# Patient Record
Sex: Male | Born: 1995
Health system: Southern US, Community
[De-identification: ages and names within clinical notes are randomized; demographics above are authoritative.]

## PROBLEM LIST (undated history)

## (undated) DIAGNOSIS — H5000 Unspecified esotropia: Secondary | ICD-10-CM

## (undated) DIAGNOSIS — Z8719 Personal history of other diseases of the digestive system: Secondary | ICD-10-CM

## (undated) DIAGNOSIS — Z87820 Personal history of traumatic brain injury: Secondary | ICD-10-CM

## (undated) HISTORY — PX: WISDOM TOOTH EXTRACTION: SHX21

## (undated) HISTORY — PX: PILONIDAL CYST EXCISION: SHX744

## (undated) HISTORY — PX: TYMPANOPLASTY WITH GRAFT: SHX6567

---
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
## 2015-11-03 MED FILL — PREVIDENT 5000 BOOSTER PLUS: 1.1 | 30 days supply | Qty: 100 | Fill #0

## 2015-12-31 ENCOUNTER — Ambulatory Visit: Payer: 59 | Admitting: Family Medicine

## 2016-01-07 DIAGNOSIS — H5203 Hypermetropia, bilateral: Secondary | ICD-10-CM | POA: Diagnosis not present

## 2016-01-07 DIAGNOSIS — H5043 Accommodative component in esotropia: Secondary | ICD-10-CM | POA: Diagnosis not present

## 2016-02-05 ENCOUNTER — Ambulatory Visit: Payer: 59 | Admitting: Family Medicine

## 2016-04-29 ENCOUNTER — Encounter: Payer: Self-pay | Admitting: Family Medicine

## 2016-04-29 ENCOUNTER — Ambulatory Visit (INDEPENDENT_AMBULATORY_CARE_PROVIDER_SITE_OTHER): Payer: 59 | Admitting: Family Medicine

## 2016-04-29 VITALS — BP 104/66 | HR 82 | Temp 97.8°F | Ht 75.25 in | Wt 188.0 lb

## 2016-04-29 DIAGNOSIS — J0100 Acute maxillary sinusitis, unspecified: Secondary | ICD-10-CM | POA: Diagnosis not present

## 2016-04-29 DIAGNOSIS — K219 Gastro-esophageal reflux disease without esophagitis: Secondary | ICD-10-CM | POA: Insufficient documentation

## 2016-04-29 MED ORDER — PREDNISONE 20 MG PO TABS: ORAL_TABLET | ORAL | Status: DC

## 2016-04-29 MED ORDER — AMOXICILLIN-POT CLAVULANATE 875-125 MG PO TABS: 1.0000 | ORAL_TABLET | Freq: Two times a day (BID) | ORAL | Status: DC

## 2016-04-29 MED FILL — AMOX TR-K CLV 875-125 MG TA: 875-125 | 10 days supply | Qty: 20 | Fill #0

## 2016-04-29 MED FILL — predniSONE 20 MG TABS: 20 | 7 days supply | Qty: 10 | Fill #0

## 2016-04-29 NOTE — Patient Instructions (Addendum)
Seems like this is more focused in sinuses at least today- cover for sinusitis with prednisone and augmentin  If symptoms persist over the next week- call us and we can set up chest x-ray with lingering shortness of breath and cough  Further decision making at that point

## 2016-04-29 NOTE — Progress Notes (Signed)
Phone: [phone]  Subjective:  Patient presents today to establish care. Was seen by Netta Cedarshris Miller, MD previously [facility]. Chief complaint-noted.   See problem oriented charting  The following were reviewed and entered/updated in epic: Past Medical History  Diagnosis Date  . GERD (gastroesophageal reflux disease)     rare issues  . Pilonidal cyst with abscess   . Concussion     basketball at [employer]   Patient Active Problem List   Diagnosis Date Noted  . GERD (gastroesophageal reflux disease)   . Pilonidal cyst 05/22/2013   Past Surgical History  Procedure Laterality Date  . Eardrum reconstruction      twice. perforated ear drum  . Tonsillectomy    . Pilonidal cyst excision      around 2014    Family History  Problem Relation Age of Onset  . Breast cancer Maternal Grandmother     breast  . Melanoma Paternal Grandfather   . Hashimoto's thyroiditis Mother     now on thyroid medicine  . Kidney Stones Father   . Diabetes Paternal Grandfather     Medications- reviewed and updated No current outpatient prescriptions on file.   No current facility-administered medications for this visit.    Allergies-reviewed and updated No Known Allergies  Social History   Social History  . Marital Status: Single    Spouse Name: N/A  . Number of Children: N/A  . Years of Education: N/A   Social History Main Topics  . Smoking status: Never Smoker   . Smokeless tobacco: Never Used  . Alcohol Use: No  . Drug Use: No  . Sexual Activity: Not Asked   Other Topics Concern  . None   Social History Narrative   Single.       [employer]. Studying Business. Thinking about sports medicine. Rising junior fall 2017.    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 Negative for fever and chills.  HENT: Positive for congestion. Negative for ear discharge  and ear pain.   Eyes: Negative for blurred vision and double vision.  Respiratory: Positive for cough, sputum production, shortness of breath and wheezing.   Cardiovascular: Negative for chest pain and palpitations.  Gastrointestinal: Negative for heartburn, nausea and vomiting.  Genitourinary: Negative for dysuria and urgency.  Musculoskeletal: Negative for myalgias and neck pain.  Skin: Negative for itching and rash.  Neurological: Negative for dizziness and tingling.  Endo/Heme/Allergies: Negative for polydipsia. Does not bruise/bleed easily.  Psychiatric/Behavioral: Negative for hallucinations and substance abuse.    Objective: BP 104/66 mmHg  Pulse 82  Temp(Src) 97.8 F (36.6 C) (Oral)  Ht 6' 3.25" (1.911 m)  Wt 188 lb (85.276 kg)  BMI 23.35 kg/m2  SpO2 97% Gen: NAD, resting comfortably HEENT: Mucous membranes are moist. Oropharynx normal. TM normal on left- skin graft on right ear with mild erythema. Nares erythematous with yellow drainage. Maxillary sinuses moderately tender to palpation. Mild pharyngeal erythema Eyes: sclera and lids normal, PERRLA Neck: no thyromegaly, no cervical lymphadenopathy CV: RRR no murmurs rubs or gallops Lungs: CTAB no crackles, wheeze, rhonchi Abdomen: soft/nontender/nondistended/normal bowel sounds. No rebound or guarding.  Ext: no edema Skin: warm, dry Neuro: 5/5 strength in upper and lower extremities, normal gait, normal reflexes  Assessment/Plan:  Cough, sinus congestion, sinus pressure Some wheeze S: right before finals finished up- early May had a cough/head  cold- took some OTC medicines and symptoms came and went. Left May 30th and studied in [place], Got back yesterday. Has been experiencing wheezing which he has never had before. Went to doctor there and was given steroid shot, medrol, inhaler around 2nd week of June. Treated with cefixmine. Felt better for short period and then worsened. Did miss some doses of medicine.   Last  few days worsened again. Symptoms included sinus pressure bilaterally which had previously- that had improved previously. Ears with more pressure than normal on plane. Got back yesterday. Dealing with cough. Still dealing with some wheezing and shortness of breath. Inhaler helps when used. 5-6 weeks total.  A/P: Seems that sinusitis symptoms are most prominent at least on exam today- will cover given double sickening for bacterial sinusitis with prednisone and augmentin. Patient can reach out in about 10 days and if not improved or worsens can set up with CXR given almost 6 weeks of cough. Appears to have had bronchitis before but seems to be clearing. Depending on symptoms over next 10 days- may consider round of levaquin if this sinusitis treatment fails. May need further workup if persists past treatment as wel.   Meds ordered this encounter  Medications  . amoxicillin-clavulanate (AUGMENTIN) 875-125 MG tablet    Sig: Take 1 tablet by mouth 2 (two) times daily.    Dispense:  20 tablet    Refill:  0  . predniSONE (DELTASONE) 20 MG tablet    Sig: Take 2 pills for 3 days, 1 pill for 4 days    Dispense:  10 tablet    Refill:  0   The duration of face-to-face time during this visit was 30 minutes. Greater than 50% of this time was spent in counseling, explanation of diagnosis, planning of further management, and/or coordination of care.   Return precautions advised.  Tana ConchStephen Hunter, MD

## 2016-04-29 NOTE — Progress Notes (Signed)
Pre visit review using our clinic review tool, if applicable. No additional management support is needed unless otherwise documented below in the visit note. 

## 2016-05-03 MED FILL — PREVIDENT 5000 BOOSTER PLUS: 1.1 | 30 days supply | Qty: 100 | Fill #1

## 2016-05-09 ENCOUNTER — Telehealth: Payer: Self-pay | Admitting: Emergency Medicine

## 2016-05-09 ENCOUNTER — Telehealth: Payer: Self-pay | Admitting: Family Medicine

## 2016-05-09 NOTE — Telephone Encounter (Signed)
[facility] - Client TELEPHONE ADVICE RECORD [facility] Patient Name: Michael Sullivan DOB: [address]/08/1996 Initial Comment Caller states 20 yr old son was given meds 8-10 days ago; not better; sinus infection, possible bronchitits; Nurse Assessment Nurse: Debera Latalston, RN, Tinnie GensJeffrey Date/Time (Eastern Time): 05/09/2016 8:57:01 AM Confirm and document reason for call. If symptomatic, describe symptoms. You must click the next button to save text entered. ---Caller states 20 yr old son was given meds 8-10 days ago; not better; sinus infection, possible bronchitis. 1 day left on antibiotic and completed steroid course. No fever. Has the patient traveled out of the country within the last 30 days? ---No Does the patient have any new or worsening symptoms? ---Yes Will a triage be completed? ---Yes Related visit to physician within the last 2 weeks? ---Yes Does the PT have any chronic conditions? (i.e. diabetes, asthma, etc.) ---No Is this a behavioral health or substance abuse call? ---No Guidelines Guideline Title Affirmed Question Affirmed Notes Sinus Infection on Antibiotic Follow-up Call [1] Taking antibiotic > 72 hours (3 days) AND [2] sinus pain not improved Final Disposition User See Physician within 24 Hours Debera Latalston, RN, Abbott LaboratoriesJeffrey Referrals REFERRED TO PCP OFFICE Disagree/Comply: Danella Maiersomply

## 2016-05-09 NOTE — Telephone Encounter (Signed)
[facility]

## 2016-05-09 NOTE — Telephone Encounter (Signed)
Follow- up appointment made for 05/10/16 with Shirline Freesory Nafziger.      PLEASE NOTE: All timestamps contained within this report are represented as [place] Standard Time. CONFIDENTIALTY NOTICE: This fax transmission is intended only for the addressee. It contains information that is legally privileged, confidential or otherwise protected from use or disclosure. If you are not the intended recipient, you are strictly prohibited from reviewing, disclosing, copying using or disseminating any of this information or taking any action in reliance on or regarding this information. If you have received this fax in error, please notify us immediately by telephone so that we can arrange for its return to us. Phone: [phone], Toll-Free: [phone], Fax: [phone] Page: 1 of 2 Call Id: [id number] [facility] - Client TELEPHONE ADVICE RECORD [facility] Patient Name: Michael SchaumannKEVIN Sullivan Gender: Male DOB: 13-Jul-1996 Age: 89 Y 4 M 6 D Return Phone Number: [phone] (Primary), [phone] (Secondary) Address: [place]: [place] [id number] Client [facility] - Client Client Site [facility] Day Physician Tana ConchHunter, Stephen - MD Contact Type Call Who Is Calling Patient / Member / Family / Caregiver Call Type Triage / Clinical Caller Name Edgar FriskBrian Seyller Relationship To Patient Father Return Phone Number [phone] (Primary) Chief Complaint Cough Reason for Call Symptomatic / Request for Health Information Initial Comment Caller states 20 yr old son was given meds 8-10 days ago; not better; sinus infection, possible bronchitits; Appointment Disposition EMR Appointment Scheduled Info pasted into Epic Yes PreDisposition Call Doctor Translation No Nurse Assessment Nurse: Debera Latalston, RN, Tinnie GensJeffrey Date/Time Lamount Cohen(Eastern Time): 05/09/2016 8:57:01 AM Confirm and document reason for call. If symptomatic, describe  symptoms. You must click the next button to save text entered. ---Caller states 20 yr old son was given meds 8-10 days ago; not better; sinus infection, possible bronchitis. 1 day on antibiotic and completed steroid course. No fever. Has the patient traveled out of the country within the last 30 days? ---No Does the patient have any new or worsening symptoms? ---Yes Will a triage be completed? ---Yes Related visit to physician within the last 2 weeks? ---Yes Does the PT have any chronic conditions? (i.e. diabetes, asthma, etc.) ---No Is this a behavioral health or substance abuse call? ---No Guidelines Guideline Title Affirmed Question Affirmed Notes Nurse Date/Time Lamount Cohen(Eastern Time) Sinus Infection on Antibiotic Follow-up Call [1] Taking antibiotic > 72 hours (3 days) AND [2] sinus pain not improved Debera Latalston, RN, Tinnie GensJeffrey 05/09/2016 8:59:18 AM Disp. Time Lamount Cohen(Eastern Time) Disposition Final User 05/09/2016 9:01:28 AM See Physician within 24 Hours Yes Debera Latalston, RN, Tinnie GensJeffrey PLEASE NOTE: All timestamps contained within this report are represented as [place] Standard Time. CONFIDENTIALTY NOTICE: This fax transmission is intended only for the addressee. It contains information that is legally privileged, confidential or otherwise protected from use or disclosure. If you are not the intended recipient, you are strictly prohibited from reviewing, disclosing, copying using or disseminating any of this information or taking any action in reliance on or regarding this information. If you have received this fax in error, please notify us immediately by telephone so that we can arrange for its return to us. Phone: [phone], Toll-Free: [phone], Fax: [phone] Page: 2 of 2 Call Id: [id number] Caller Understands: Yes Disagree/Comply: Comply Care Advice Given Per Guideline SEE PHYSICIAN WITHIN 24 HOURS: * IF OFFICE WILL BE OPEN: You need to be seen within the next 24 hours. Call your doctor when  the office opens, and make an appointment. ANTIBIOTIC: Continue taking  the prescribed antibiotic. PAIN MEDICINES: * For pain relief, take acetaminophen, ibuprofen, or naproxen. * Use the lowest amount that makes your pain feel better. ACETAMINOPHEN (E.G., TYLENOL): * Another choice is to take 1,000 mg (two 500 mg pills) every 8 hours as needed. Each Extra Strength Tylenol pill has 500 mg of acetaminophen. The most you should take each day is 3,000 mg (6 Extra Strength pills a day). * Take 650 mg (two 325 mg pills) by mouth every 4-6 hours as needed. Each Regular Strength Tylenol pill has 325 mg of acetaminophen. The most you should take each day is 3,250 mg (10 Regular Strength pills a day). IBUPROFEN (E.G., MOTRIN, ADVIL): * Take 400 mg (two 200 mg pills) by mouth every 6 hours as needed. * Another choice is to take 600 mg (three 200 mg pills) by mouth every 8 hours as needed. * The most you should take each day is 1,200 mg (six 200 mg pills a day), unless your doctor has told you to take more. FOR A STUFFY NOSE - USE NASAL WASHES: * Introduction: Saline (salt water) nasal irrigation (nasal wash) is an effective and simple home remedy for treating stuffy nose and sinus congestion. The nose can be irrigated by pouring, spraying, or squirting salt water into the nose and then letting it run back out. * How it Helps: The salt water rinses out excess mucus, washes out any irritants (dust, allergens) that might be present, and moistens the nasal cavity. * Methods: There are several ways to perform nasal irrigation. You can use a saline nasal spray bottle (available over-the-counter), a rubber ear syringe, a medical syringe without the needle, or a NETI POT. STEP-BY-STEP INSTRUCTIONS: * STEP 1: Lean over a sink. * STEP 2: Gently squirt or spray warm salt water into one of your nostrils. * STEP 3: Some of the water may run into the back of your throat. Spit this out. If you swallow the salt water it will  not hurt you. * STEP 4: Blow your nose to clean out the water and mucus. * STEP 5: Repeat steps 1-4 for the other nostril. You can do this a couple times a day if it seems to help you. NASAL DECONGESTANTS FOR A VERY STUFFY NOSE: * MOST PEOPLE DO NOT NEED TO USE THESE MEDICINES. * If you have a very stuffy nose, nasal decongestant medicines can shrink the swollen nasal mucosa and allow for easier breathing. If you have a very runny nose, these medicines can reduce the amount of drainage. They may be taken as pills by mouth or as a nasal spray. * If your nose feels blocked, you should try using nasal washes first. * Pseudoephedrine (Sudafed): Available over-the-counter in pill form. Typical adult dosage is two 30 mg tablets every 6 hours. * Oxymetazoline Nasal Drops (Afrin): Available over-thecounter. Clean out the nose before using. Spray each nostril once, wait one minute for absorption, and then spray a second time. * Phenylephrine Nasal Drops (Neo-Synephrine): Available over-the-counter. Clean out the nose before using. Spray each nostril once, wait one minute for absorption, and then spray a second time. * Read the package instructions on all medicines that you take. CAUTION - NASAL DECONGESTANTS: * Do not take these medications if you have high blood pressure, heart disease, prostate enlargement, or an overactive thyroid. * Do not take these medications if you are pregnant. * Do not take these medications if you have used a MAO inhibitor such as isocarboxazid (Marplan), phenelzine (Nardil),  rasagiline (Azilect), selegiline (Eldepryl, Emsam), or tranylcypromine (Parnate) in the past 2 weeks. Life-threatening side effects can occur. * Do not use these medications for more than 3 days (Reason: rebound nasal congestion). CALL BACK IF: * Difficulty breathing (and not relieved by cleaning out nose) * You become worse. CARE ADVICE given per Sinus Infection on Antibiotic Follow-Up Call (Adult)  guideline. Referrals REFERRED TO PCP OFFICE

## 2016-05-10 ENCOUNTER — Ambulatory Visit (INDEPENDENT_AMBULATORY_CARE_PROVIDER_SITE_OTHER): Payer: 59 | Admitting: Adult Health

## 2016-05-10 ENCOUNTER — Encounter: Payer: Self-pay | Admitting: Adult Health

## 2016-05-10 ENCOUNTER — Ambulatory Visit (INDEPENDENT_AMBULATORY_CARE_PROVIDER_SITE_OTHER)
Admission: RE | Admit: 2016-05-10 | Discharge: 2016-05-10 | Disposition: A | Discharge status: Home / Self Care | Payer: 59 | Source: Ambulatory Visit | Attending: Adult Health | Admitting: Adult Health

## 2016-05-10 VITALS — BP 132/90 | Temp 97.9°F | Ht 75.25 in | Wt 194.0 lb

## 2016-05-10 DIAGNOSIS — R05 Cough: Secondary | ICD-10-CM | POA: Diagnosis not present

## 2016-05-10 DIAGNOSIS — R059 Cough, unspecified: Secondary | ICD-10-CM

## 2016-05-10 DIAGNOSIS — J0140 Acute pansinusitis, unspecified: Secondary | ICD-10-CM

## 2016-05-10 MED ORDER — LEVOFLOXACIN 500 MG PO TABS: 750.0000 mg | ORAL_TABLET | Freq: Every day | ORAL | Status: DC

## 2016-05-10 MED FILL — levoFLOXacin 750 MG TABS: 750 | 5 days supply | Qty: 5 | Fill #0

## 2016-05-10 NOTE — Progress Notes (Signed)
Subjective:    Patient ID: Michael Sullivan, male    DOB: 12-01-1995, 20 y.o.   MRN: [id number]  HPI  20 year old male, patient of Dr. Durene Cal who I am seeing today for a follow up. He last saw Dr. Durene Cal on 04/29/2016 and was prescribed Augmentin and Prednisone for possible sinusitis and cough x 5-6 weeks.   Today in the office he reports that his symptoms have not improved since finishing abx and steroid. He continues to have a semi productive cough and sinusitis type symptoms.   He denies any nausea, vomiting, diarrhea or constipation. He has not been febrile.   Review of Systems  Constitutional: Positive for activity change, appetite change and fatigue. Negative for fever and diaphoresis.  HENT: Positive for congestion, postnasal drip, rhinorrhea and sinus pressure.   Respiratory: Positive for cough, shortness of breath and wheezing.   Cardiovascular: Negative.   Gastrointestinal: Negative.   Neurological: Negative.    Past Medical History  Diagnosis Date  . GERD (gastroesophageal reflux disease)     rare issues  . Pilonidal cyst with abscess   . Concussion     basketball at [employer]    Social History   Social History  . Marital Status: Single    Spouse Name: N/A  . Number of Children: N/A  . Years of Education: N/A   Occupational History  . Not on file.   Social History Main Topics  . Smoking status: Never Smoker   . Smokeless tobacco: Never Used  . Alcohol Use: No  . Drug Use: No  . Sexual Activity: Not on file   Other Topics Concern  . Not on file   Social History Narrative   Single.       [employer]. Studying Business. Thinking about sports medicine. Rising junior fall 2017.    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 Eardrum reconstruction      twice. perforated ear drum  . Tonsillectomy    . Pilonidal cyst excision      around 2014    Family History    Problem Relation Age of Onset  . Breast cancer Maternal Grandmother     breast  . Melanoma Paternal Grandfather   . Hashimoto's thyroiditis Mother     now on thyroid medicine  . Kidney Stones Father   . Diabetes Paternal Grandfather     No Known Allergies  Current Outpatient Prescriptions on File Prior to Visit  Medication Sig Dispense Refill  . amoxicillin-clavulanate (AUGMENTIN) 875-125 MG tablet Take 1 tablet by mouth 2 (two) times daily. 20 tablet 0   No current facility-administered medications on file prior to visit.    BP 132/90 mmHg  Temp(Src) 97.9 F (36.6 C) (Oral)  Ht 6' 3.25" (1.911 m)  Wt 194 lb (87.998 kg)  BMI 24.10 kg/m2       Objective:   Physical Exam  Constitutional: He is oriented to person, place, and time. He appears well-developed and well-nourished. No distress.  HENT:  Right Ear: Hearing, tympanic membrane, external ear and ear canal normal.  Left Ear: Hearing, tympanic membrane, external ear and ear canal normal.  Nose: Right sinus exhibits maxillary sinus tenderness and frontal sinus tenderness. Left sinus exhibits maxillary sinus tenderness and frontal sinus tenderness.  Mouth/Throat: Uvula is midline and mucous membranes are normal. Oropharyngeal exudate present. No posterior oropharyngeal edema  or posterior oropharyngeal erythema.  Cardiovascular: Normal rate, regular rhythm, normal heart sounds and intact distal pulses.  Exam reveals no gallop and no friction rub.   No murmur heard. Pulmonary/Chest: Effort normal. No respiratory distress. He has wheezes. He has no rales. He exhibits no tenderness.  Trace wheezes at base  Neurological: He is alert and oriented to person, place, and time.  Skin: Skin is warm and dry. No rash noted. He is not diaphoretic. No erythema. No pallor.  Psychiatric: He has a normal mood and affect. His behavior is normal. Judgment and thought content normal.  Nursing note and vitals reviewed.     Assessment &  Plan:  1. Subacute pansinusitis - levofloxacin (LEVAQUIN) 500 MG tablet; Take 1.5 tablets (750 mg total) by mouth daily.  Dispense: 7 tablet; Refill: 0 - Consider referral to ENT and/or CT sinus - Follow up with PCP as needed  2. Cough - DG Chest 2 View; Future - Consider an additional dose of Prednisone, depending on what chest x ray shows  Shirline Freesory Calyx Hawker, NP

## 2016-05-10 NOTE — Patient Instructions (Addendum)
I have sent in a prescription for Levaquin. Take this daily for 7 days   I will follow up with you regarding your chest x ray .   Follow up with Dr. Durene CalHunter if no improvement after this round of antibiotics

## 2016-09-14 ENCOUNTER — Ambulatory Visit (INDEPENDENT_AMBULATORY_CARE_PROVIDER_SITE_OTHER): Payer: 59

## 2016-09-14 DIAGNOSIS — Z23 Encounter for immunization: Secondary | ICD-10-CM | POA: Diagnosis not present

## 2017-02-21 DIAGNOSIS — H50011 Monocular esotropia, right eye: Secondary | ICD-10-CM

## 2017-02-21 DIAGNOSIS — H5000 Unspecified esotropia: Secondary | ICD-10-CM

## 2017-02-21 HISTORY — DX: Monocular esotropia, right eye: H50.011

## 2017-02-21 HISTORY — DX: Unspecified esotropia: H50.00

## 2017-03-06 DIAGNOSIS — H0015 Chalazion left lower eyelid: Secondary | ICD-10-CM | POA: Diagnosis not present

## 2017-03-06 DIAGNOSIS — H5043 Accommodative component in esotropia: Secondary | ICD-10-CM | POA: Diagnosis not present

## 2017-03-06 DIAGNOSIS — H5203 Hypermetropia, bilateral: Secondary | ICD-10-CM | POA: Diagnosis not present

## 2017-03-14 ENCOUNTER — Encounter (HOSPITAL_BASED_OUTPATIENT_CLINIC_OR_DEPARTMENT_OTHER): Payer: Self-pay | Admitting: *Deleted

## 2017-03-15 DIAGNOSIS — H5043 Accommodative component in esotropia: Secondary | ICD-10-CM | POA: Diagnosis not present

## 2017-03-16 ENCOUNTER — Ambulatory Visit: Payer: Self-pay | Admitting: Ophthalmology

## 2017-03-16 NOTE — H&P (Signed)
Date of examination:  03-15-17  Indication for surgery: to straighten the eyes and allow some binocularity  Pertinent past medical history:  Past Medical History:  Diagnosis Date  . 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 History of concussion 08/2013  . History of esophageal reflux    as a child    Pertinent ocular history:  Partially accommodative esotropia, dx'd age 21, has residual ET despite hyperopic RX  Pertinent family history:  Family History  Problem Relation Age of Onset  . Hashimoto's thyroiditis Mother        now on thyroid medicine  . Kidney Stones Father   . Breast cancer Maternal Grandmother        breast  . Melanoma Paternal Grandfather   . Diabetes Paternal Grandfather     General:  Healthy appearing patient in no distress.    Eyes:    Acuity  cc OD 20/20  OS 20/20  External: Within normal limits     Anterior segment: Within normal limits     Motility:  Cc  ET=20 comitant, ET'=20, rots nl  Fundus: Normal     Refraction:  Manifest  +1 OU approx  Heart: Regular rate and rhythm without murmur     Lungs: Clear to auscultation         Impression:  Partially accommodative esotropia  Plan: Right medial rectus muscle recession  Goddess Gebbia O  

## 2017-03-17 ENCOUNTER — Encounter (HOSPITAL_BASED_OUTPATIENT_CLINIC_OR_DEPARTMENT_OTHER): Payer: Self-pay | Admitting: *Deleted

## 2017-03-17 ENCOUNTER — Ambulatory Visit (HOSPITAL_BASED_OUTPATIENT_CLINIC_OR_DEPARTMENT_OTHER): Payer: 59 | Admitting: Anesthesiology

## 2017-03-17 ENCOUNTER — Ambulatory Visit (HOSPITAL_BASED_OUTPATIENT_CLINIC_OR_DEPARTMENT_OTHER)
Admission: RE | Admit: 2017-03-17 | Discharge: 2017-03-17 | Disposition: A | Discharge status: Home / Self Care | Payer: 59 | Source: Ambulatory Visit | Attending: Ophthalmology | Admitting: Ophthalmology

## 2017-03-17 ENCOUNTER — Encounter (HOSPITAL_BASED_OUTPATIENT_CLINIC_OR_DEPARTMENT_OTHER)
Admission: RE | Disposition: A | Discharge status: Home / Self Care | Payer: Self-pay | Source: Ambulatory Visit | Attending: Ophthalmology

## 2017-03-17 DIAGNOSIS — Z808 Family history of malignant neoplasm of other organs or systems: Secondary | ICD-10-CM | POA: Diagnosis not present

## 2017-03-17 DIAGNOSIS — Z8489 Family history of other specified conditions: Secondary | ICD-10-CM | POA: Insufficient documentation

## 2017-03-17 DIAGNOSIS — Z803 Family history of malignant neoplasm of breast: Secondary | ICD-10-CM | POA: Diagnosis not present

## 2017-03-17 DIAGNOSIS — H5000 Unspecified esotropia: Secondary | ICD-10-CM | POA: Diagnosis not present

## 2017-03-17 DIAGNOSIS — H5043 Accommodative component in esotropia: Secondary | ICD-10-CM | POA: Diagnosis not present

## 2017-03-17 DIAGNOSIS — K219 Gastro-esophageal reflux disease without esophagitis: Secondary | ICD-10-CM | POA: Diagnosis not present

## 2017-03-17 DIAGNOSIS — L0591 Pilonidal cyst without abscess: Secondary | ICD-10-CM | POA: Diagnosis not present

## 2017-03-17 HISTORY — DX: Personal history of other diseases of the digestive system: Z87.19

## 2017-03-17 HISTORY — DX: Unspecified esotropia: H50.00

## 2017-03-17 HISTORY — PX: STRABISMUS SURGERY: SHX218

## 2017-03-17 HISTORY — DX: Personal history of traumatic brain injury: Z87.820

## 2017-03-17 SURGERY — REPAIR STRABISMUS
Anesthesia: General | Site: Eye | Laterality: Right

## 2017-03-17 MED ORDER — MIDAZOLAM HCL 2 MG/2ML IJ SOLN
INTRAMUSCULAR | Status: AC
  Filled 2017-03-17: qty 2

## 2017-03-17 MED ORDER — LIDOCAINE 2% (20 MG/ML) 5 ML SYRINGE
INTRAMUSCULAR | Status: AC
  Filled 2017-03-17: qty 5

## 2017-03-17 MED ORDER — TOBRAMYCIN-DEXAMETHASONE 0.3-0.1 % OP OINT
TOPICAL_OINTMENT | OPHTHALMIC | Status: DC | PRN
  Administered 2017-03-17: 1 via OPHTHALMIC

## 2017-03-17 MED ORDER — ONDANSETRON HCL 4 MG/2ML IJ SOLN
INTRAMUSCULAR | Status: DC | PRN
  Administered 2017-03-17: 4 mg via INTRAVENOUS

## 2017-03-17 MED ORDER — TOBRAMYCIN-DEXAMETHASONE 0.3-0.1 % OP OINT: 1.0000 "application " | TOPICAL_OINTMENT | Freq: Two times a day (BID) | OPHTHALMIC | 0 refills | Status: DC

## 2017-03-17 MED ORDER — MIDAZOLAM HCL 2 MG/2ML IJ SOLN
1.0000 mg | INTRAMUSCULAR | Status: DC | PRN
  Administered 2017-03-17: 2 mg via INTRAVENOUS

## 2017-03-17 MED ORDER — KETOROLAC TROMETHAMINE 30 MG/ML IJ SOLN
INTRAMUSCULAR | Status: DC | PRN
  Administered 2017-03-17: 30 mg via INTRAVENOUS

## 2017-03-17 MED ORDER — FENTANYL CITRATE (PF) 100 MCG/2ML IJ SOLN
INTRAMUSCULAR | Status: AC
  Filled 2017-03-17: qty 2

## 2017-03-17 MED ORDER — FENTANYL CITRATE (PF) 100 MCG/2ML IJ SOLN: 25.0000 ug | INTRAMUSCULAR | Status: DC | PRN

## 2017-03-17 MED ORDER — DEXAMETHASONE SODIUM PHOSPHATE 4 MG/ML IJ SOLN
INTRAMUSCULAR | Status: DC | PRN
  Administered 2017-03-17: 10 mg via INTRAVENOUS

## 2017-03-17 MED ORDER — ONDANSETRON HCL 4 MG/2ML IJ SOLN
INTRAMUSCULAR | Status: AC
  Filled 2017-03-17: qty 2

## 2017-03-17 MED ORDER — LIDOCAINE 2% (20 MG/ML) 5 ML SYRINGE
INTRAMUSCULAR | Status: DC | PRN
  Administered 2017-03-17: 60 mg via INTRAVENOUS

## 2017-03-17 MED ORDER — LACTATED RINGERS IV SOLN
INTRAVENOUS | Status: DC
  Administered 2017-03-17: 08:00:00 via INTRAVENOUS

## 2017-03-17 MED ORDER — GLYCOPYRROLATE 0.2 MG/ML IV SOSY
PREFILLED_SYRINGE | INTRAVENOUS | Status: DC | PRN
  Administered 2017-03-17 (×3): .2 mg via INTRAVENOUS

## 2017-03-17 MED ORDER — DEXAMETHASONE SODIUM PHOSPHATE 10 MG/ML IJ SOLN
INTRAMUSCULAR | Status: AC
  Filled 2017-03-17: qty 1

## 2017-03-17 MED ORDER — SCOPOLAMINE 1 MG/3DAYS TD PT72: 1.0000 | MEDICATED_PATCH | Freq: Once | TRANSDERMAL | Status: DC | PRN

## 2017-03-17 MED ORDER — ONDANSETRON HCL 4 MG/2ML IJ SOLN: 4.0000 mg | Freq: Once | INTRAMUSCULAR | Status: DC | PRN

## 2017-03-17 MED ORDER — ACETAMINOPHEN 10 MG/ML IV SOLN
1000.0000 mg | Freq: Once | INTRAVENOUS | Status: AC
  Administered 2017-03-17: 1000 mg via INTRAVENOUS

## 2017-03-17 MED ORDER — ACETAMINOPHEN 10 MG/ML IV SOLN
INTRAVENOUS | Status: AC
  Filled 2017-03-17: qty 100

## 2017-03-17 MED ORDER — PROPOFOL 500 MG/50ML IV EMUL
INTRAVENOUS | Status: AC
  Filled 2017-03-17: qty 50

## 2017-03-17 MED ORDER — FENTANYL CITRATE (PF) 100 MCG/2ML IJ SOLN
50.0000 ug | INTRAMUSCULAR | Status: DC | PRN
  Administered 2017-03-17: 100 ug via INTRAVENOUS

## 2017-03-17 MED ORDER — KETOROLAC TROMETHAMINE 30 MG/ML IJ SOLN
INTRAMUSCULAR | Status: AC
  Filled 2017-03-17: qty 1

## 2017-03-17 MED ORDER — PROPOFOL 10 MG/ML IV BOLUS
INTRAVENOUS | Status: DC | PRN
Start: 2017-03-17 — End: 2017-03-17
  Administered 2017-03-17: 30 mg via INTRAVENOUS
  Administered 2017-03-17: 200 mg via INTRAVENOUS

## 2017-03-17 SURGICAL SUPPLY — 34 items
APL SRG 3 HI ABS STRL LF PLS (MISCELLANEOUS) ×1
APPLICATOR COTTON TIP 6IN STRL (MISCELLANEOUS) ×12 IMPLANT
APPLICATOR DR MATTHEWS STRL (MISCELLANEOUS) ×3 IMPLANT
BANDAGE EYE OVAL (MISCELLANEOUS) IMPLANT
CAUTERY EYE LOW TEMP 1300F FIN (OPHTHALMIC RELATED) IMPLANT
CLOSURE WOUND 1/4X4 (GAUZE/BANDAGES/DRESSINGS)
COVER BACK TABLE 60X90IN (DRAPES) ×3 IMPLANT
COVER MAYO STAND STRL (DRAPES) ×3 IMPLANT
DRAPE SURG 17X23 STRL (DRAPES) ×6 IMPLANT
DRAPE U-SHAPE 76X120 STRL (DRAPES) ×2 IMPLANT
GLOVE BIO SURGEON STRL SZ 6 (GLOVE) ×2 IMPLANT
GLOVE BIO SURGEON STRL SZ 6.5 (GLOVE) ×5 IMPLANT
GLOVE BIO SURGEONS STRL SZ 6.5 (GLOVE) ×3
GLOVE BIOGEL M STRL SZ7.5 (GLOVE) ×3 IMPLANT
GOWN STRL REUS W/ TWL LRG LVL3 (GOWN DISPOSABLE) ×1 IMPLANT
GOWN STRL REUS W/TWL LRG LVL3 (GOWN DISPOSABLE) ×9
GOWN STRL REUS W/TWL XL LVL3 (GOWN DISPOSABLE) ×3 IMPLANT
NS IRRIG 1000ML POUR BTL (IV SOLUTION) ×3 IMPLANT
PACK BASIN DAY SURGERY FS (CUSTOM PROCEDURE TRAY) ×3 IMPLANT
SHEET MEDIUM DRAPE 40X70 STRL (DRAPES) IMPLANT
SLEEVE SCD COMPRESS KNEE MED (MISCELLANEOUS) ×3 IMPLANT
SPEAR EYE SURG WECK-CEL (MISCELLANEOUS) ×6 IMPLANT
STRIP CLOSURE SKIN 1/4X4 (GAUZE/BANDAGES/DRESSINGS) IMPLANT
SUT 6 0 SILK T G140 8DA (SUTURE) IMPLANT
SUT MERSILENE 6-0 18IN S14 8MM (SUTURE)
SUT PLAIN 6 0 TG1408 (SUTURE) ×2 IMPLANT
SUT SILK 4 0 C 3 735G (SUTURE) IMPLANT
SUT VICRYL 6 0 S 28 (SUTURE) IMPLANT
SUT VICRYL ABS 6-0 S29 18IN (SUTURE) ×2 IMPLANT
SUTURE MERSLN 6-0 18IN S14 8MM (SUTURE) IMPLANT
SYR 10ML LL (SYRINGE) ×3 IMPLANT
SYR TB 1ML LL NO SAFETY (SYRINGE) ×3 IMPLANT
TOWEL OR 17X24 6PK STRL BLUE (TOWEL DISPOSABLE) ×3 IMPLANT
TRAY DSU PREP LF (CUSTOM PROCEDURE TRAY) ×3 IMPLANT

## 2017-03-17 NOTE — Transfer of Care (Signed)
Immediate Anesthesia Transfer of Care Note  Patient: Michael RichardsKevin R Wambold  Procedure(s) Performed: Procedure(s): REPAIR STRABISMUS RIGHT EYE (Right)  Patient Location: PACU  Anesthesia Type:General  Level of Consciousness: sedated  Airway & Oxygen Therapy: Patient Spontanous Breathing and Patient connected to face mask oxygen  Post-op Assessment: Report given to RN and Post -op Vital signs reviewed and stable  Post vital signs: Reviewed and stable  Last Vitals:  Vitals:   03/17/17 0743  BP: 126/71  Pulse: (!) 52  Resp: 18  Temp: 36.5 C    Last Pain:  Vitals:   03/17/17 0743  TempSrc: Oral         Complications: No apparent anesthesia complications

## 2017-03-17 NOTE — Op Note (Signed)
03/17/2017  9:41 AM  PATIENT:  Alesia RichardsKevin R Spicer  21 y.o. male  PRE-OPERATIVE DIAGNOSIS:  Esotropia     POST-OPERATIVE DIAGNOSIS:  Esotropia     PROCEDURE:  Medial rectus muscle recession  6.0 mm right eye  SURGEON:  Pasty SpillersWilliam O.Maple HudsonYoung, M.D.   ANESTHESIA:   general  COMPLICATIONS:None  DESCRIPTION OF PROCEDURE: The patient was taken to the operating room where He was identified by me. General anesthesia was induced without difficulty after placement of appropriate monitors. The patient was prepped and draped in standard sterile fashion. A lid speculum was placed in the right eye.  Through an inferonasal fornix incision through conjunctiva and Tenon's fascia, the right medial rectus muscle was engaged on a series of muscle hooks and cleared of its fascial attachments. The tendon was secured with a double-armed 6-0 Vicryl suture with a double locking bite at each border of the muscle, 1 mm from the insertion. The muscle was disinserted, and was reattached to sclera at a measured distance of 6.0 millimeters posterior to the original insertion, using direct scleral passes in crossed swords fashion.  The suture ends were tied securely after the position of the muscle had been checked and found to be accurate. Conjunctiva was closed with 1 6-0 plain gut suture.  TobraDex ointment was placed in the right eye. The patient was awakened without difficulty and taken to the recovery room in stable condition, having suffered no intraoperative or immediate postoperative complications.  Pasty SpillersWilliam O. Fin Hupp M.D.    PATIENT DISPOSITION:  PACU - hemodynamically stable.

## 2017-03-17 NOTE — Anesthesia Procedure Notes (Signed)
Procedure Name: LMA Insertion Date/Time: 03/17/2017 8:55 AM Performed by: Burna CashONRAD, Brysan Mcevoy C Pre-anesthesia Checklist: Patient identified, Emergency Drugs available, Suction available and Patient being monitored Patient Re-evaluated:Patient Re-evaluated prior to inductionOxygen Delivery Method: Circle system utilized Preoxygenation: Pre-oxygenation with 100% oxygen Intubation Type: IV induction Ventilation: Mask ventilation without difficulty LMA: LMA flexible inserted LMA Size: 5.0 Number of attempts: 1 Airway Equipment and Method: Bite block Placement Confirmation: positive ETCO2 Tube secured with: Tape Dental Injury: Teeth and Oropharynx as per pre-operative assessment

## 2017-03-17 NOTE — Anesthesia Postprocedure Evaluation (Addendum)
Anesthesia Post Note  Patient: Alesia RichardsKevin R Lucatero  Procedure(s) Performed: Procedure(s) (LRB): REPAIR STRABISMUS RIGHT EYE (Right)  Patient location during evaluation: PACU Anesthesia Type: General Level of consciousness: awake, awake and alert and oriented Pain management: pain level controlled Vital Signs Assessment: post-procedure vital signs reviewed and stable Respiratory status: spontaneous breathing, nonlabored ventilation and respiratory function stable Cardiovascular status: blood pressure returned to baseline Anesthetic complications: no       Last Vitals:  Vitals:   03/17/17 1000 03/17/17 1015  BP: 124/78 120/83  Pulse: 81 68  Resp: (!) 8 13  Temp:      Last Pain:  Vitals:   03/17/17 0743  TempSrc: Oral                 Wauneta Silveria COKER

## 2017-03-17 NOTE — Interval H&P Note (Signed)
History and Physical Interval Note:  03/17/2017 8:39 AM  Michael Sullivan  has presented today for surgery, with the diagnosis of ESOTROPIA  The various methods of treatment have been discussed with the patient and family. After consideration of risks, benefits and other options for treatment, the patient has consented to  Procedure(s): REPAIR STRABISMUS RIGHT EYE (Right) as a surgical intervention .  The patient's history has been reviewed, patient examined, no change in status, stable for surgery.  I have reviewed the patient's chart and labs.  Questions were answered to the patient's satisfaction.     Shara BlazingYOUNG,Ladawna Walgren O

## 2017-03-17 NOTE — Anesthesia Preprocedure Evaluation (Signed)
Anesthesia Evaluation  Patient identified by MRN, date of birth, ID band Patient awake    Reviewed: Allergy & Precautions, NPO status , Patient's Chart, lab work & pertinent test results  Airway Mallampati: II  TM Distance: >3 FB Neck ROM: Full    Dental  (+) Teeth Intact, Dental Advisory Given   Pulmonary    breath sounds clear to auscultation       Cardiovascular  Rhythm:Regular Rate:Normal     Neuro/Psych    GI/Hepatic   Endo/Other    Renal/GU      Musculoskeletal   Abdominal   Peds  Hematology   Anesthesia Other Findings   Reproductive/Obstetrics                             Anesthesia Physical Anesthesia Plan  ASA: I  Anesthesia Plan: General   Post-op Pain Management:    Induction: Intravenous  Airway Management Planned: LMA  Additional Equipment:   Intra-op Plan:   Post-operative Plan:   Informed Consent: I have reviewed the patients History and Physical, chart, labs and discussed the procedure including the risks, benefits and alternatives for the proposed anesthesia with the patient or authorized representative who has indicated his/her understanding and acceptance.   Dental advisory given  Plan Discussed with: CRNA and Anesthesiologist  Anesthesia Plan Comments:         Anesthesia Quick Evaluation  

## 2017-03-17 NOTE — Discharge Instructions (Signed)
Diet: Clear liquids, advance to soft foods then regular diet as tolerated by this evening. ° °Pain control:  ° 1)  Ibuprofen 600 mg by mouth every 6-8 hours as needed for pain ° 2)  Ice pack/cold compress to operated eye(s) as desired ° °Eye medications:   Tobradex or Zylet eye ointment 1/2 inch in operated eye(s) twice a day if directed to do so by Dr. Young ° °Activity: No swimming for 1 week.  It is OK to let water run over the face and eyes while showering or taking a bath, even during the first week.  No other restriction on exercise or activity. ° °If there is a patch on one eye, leave it in place until seen in Dr. Young's office this afternoon for suture adjustment.  If there is no patch, you do not need to come to the office this afternoon; just come for your scheduled postop appointment. ° °Call Dr. Young's office [phone] with any problems or concerns. ° ° °Post Anesthesia Home Care Instructions ° °Activity: °Get plenty of rest for the remainder of the day. A responsible individual must stay with you for 24 hours following the procedure.  °For the next 24 hours, DO NOT: °-Drive a car °-Operate machinery °-Drink alcoholic beverages °-Take any medication unless instructed by your physician °-Make any legal decisions or sign important papers. ° °Meals: °Start with liquid foods such as gelatin or soup. Progress to regular foods as tolerated. Avoid greasy, spicy, heavy foods. If nausea and/or vomiting occur, drink only clear liquids until the nausea and/or vomiting subsides. Call your physician if vomiting continues. ° °Special Instructions/Symptoms: °Your throat may feel dry or sore from the anesthesia or the breathing tube placed in your throat during surgery. If this causes discomfort, gargle with warm salt water. The discomfort should disappear within 24 hours. ° °If you had a scopolamine patch placed behind your ear for the management of post- operative nausea and/or vomiting: ° °1. The medication in  the patch is effective for 72 hours, after which it should be removed.  Wrap patch in a tissue and discard in the trash. Wash hands thoroughly with soap and water. °2. You may remove the patch earlier than 72 hours if you experience unpleasant side effects which may include dry mouth, dizziness or visual disturbances. °3. Avoid touching the patch. Wash your hands with soap and water after contact with the patch. °  ° °

## 2017-03-17 NOTE — H&P (View-Only) (Signed)
Date of examination:  03-15-17  Indication for surgery: to straighten the eyes and allow some binocularity  Pertinent past medical history:  Past Medical History:  Diagnosis Date  . 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 History of concussion 08/2013  . History of esophageal reflux    as a child    Pertinent ocular history:  Partially accommodative esotropia, dx'd age 89, has residual ET despite hyperopic RX  Pertinent family history:  Family History  Problem Relation Age of Onset  . Hashimoto's thyroiditis Mother        now on thyroid medicine  . Kidney Stones Father   . Breast cancer Maternal Grandmother        breast  . Melanoma Paternal Grandfather   . Diabetes Paternal Grandfather     General:  Healthy appearing patient in no distress.    Eyes:    Acuity  cc OD 20/20  OS 20/20  External: Within normal limits     Anterior segment: Within normal limits     Motility:  Cc  ET=20 comitant, ET'=20, rots nl  Fundus: Normal     Refraction:  Manifest  +1 OU approx  Heart: Regular rate and rhythm without murmur     Lungs: Clear to auscultation         Impression:  Partially accommodative esotropia  Plan: Right medial rectus muscle recession  Angelique Chevalier O

## 2017-04-18 MED FILL — PREVIDENT 5000 BOOSTER PLUS: 1.1 | 30 days supply | Qty: 100 | Fill #0

## 2017-05-24 DIAGNOSIS — Z0100 Encounter for examination of eyes and vision without abnormal findings: Secondary | ICD-10-CM | POA: Diagnosis not present

## 2017-06-05 DIAGNOSIS — H6691 Otitis media, unspecified, right ear: Secondary | ICD-10-CM | POA: Diagnosis not present

## 2017-06-05 DIAGNOSIS — H60391 Other infective otitis externa, right ear: Secondary | ICD-10-CM | POA: Diagnosis not present

## 2017-06-15 NOTE — Addendum Note (Signed)
Addendum  created 06/15/17 1510 by Anik Wesch, MD   Sign clinical note    

## 2017-10-11 ENCOUNTER — Encounter: Payer: Self-pay | Admitting: Family Medicine

## 2017-10-11 ENCOUNTER — Ambulatory Visit (INDEPENDENT_AMBULATORY_CARE_PROVIDER_SITE_OTHER): Payer: 59

## 2017-10-11 DIAGNOSIS — Z23 Encounter for immunization: Secondary | ICD-10-CM | POA: Diagnosis not present

## 2017-10-11 NOTE — Progress Notes (Signed)
Fluvarix Quad, 0.5 mL given IM, left deltoid, mfg: Gl[employer]oSmithK[employer], lot#: [id number], exp: 04/22/2018, ndc: 81191-478-29: 58160-898-41. Pt tolerated well.

## 2017-10-11 NOTE — Progress Notes (Signed)
I have reviewed and agree with note, evaluation, plan.   Stephen Hunter, MD  

## 2017-10-13 ENCOUNTER — Encounter: Payer: Self-pay | Admitting: Sports Medicine

## 2017-10-13 ENCOUNTER — Ambulatory Visit (INDEPENDENT_AMBULATORY_CARE_PROVIDER_SITE_OTHER): Payer: 59 | Admitting: Sports Medicine

## 2017-10-13 VITALS — BP 112/78 | HR 58 | Ht 75.0 in | Wt 190.6 lb

## 2017-10-13 DIAGNOSIS — M9902 Segmental and somatic dysfunction of thoracic region: Secondary | ICD-10-CM

## 2017-10-13 DIAGNOSIS — M9901 Segmental and somatic dysfunction of cervical region: Secondary | ICD-10-CM

## 2017-10-13 DIAGNOSIS — M25511 Pain in right shoulder: Secondary | ICD-10-CM

## 2017-10-13 DIAGNOSIS — G2589 Other specified extrapyramidal and movement disorders: Secondary | ICD-10-CM

## 2017-10-13 DIAGNOSIS — M9908 Segmental and somatic dysfunction of rib cage: Secondary | ICD-10-CM

## 2017-10-13 NOTE — Patient Instructions (Addendum)
Please perform the exercise program that we have prepared for you and gone over in detail on a daily basis.  In addition to the handout you were provided you can access your program through: [email or web address]   Your unique program code is: [id number] [email or web address]   Also check out "[employer] GroupFoundation Training" which is a program developed by Dr. Myles LippsEric Goodman.   There are links to a couple of his YouTube Videos below and I would like to see performing one of his videos 5-6 days per week.    A good intro video is: "Independence from Pain 7-minute Video" - [email or web address]://[email or web address]/[email or web address]   His more advanced video is: "Powerful Posture and Pain Relief: 12 minutes of Foundation Training" - https://youtu.be/4BOTvaRaDjI  Do not try to attempt this entire video when first beginning.    Try breaking of each exercise that he goes into shorter segments.  Otherwise if they perform an exercise for 45 seconds, start with 15 seconds and rest and then resume when they begin the new activity.    If you work your way up to doing this 12 minute video, I expect you will see significant improvements in your pain.  If you enjoy his videos and would like to find out more you can look on his website: [email or web address].  He has a workout streaming option as well as a DVD set available for purchase.  Amazon has the best price for his DVDs.

## 2017-10-13 NOTE — Progress Notes (Signed)
Veverly FellsMichael D. Delorise Shinerigby, DO  [facility] [phone]  Michael RichardsKevin R Degner - 21 y.o. male MRN [id number]  Date of birth: Sullivan  Visit Date: 10/13/2017  PCP: Shelva MajesticHunter, Stephen O, MD   Referred by: Shelva MajesticHunter, Stephen O, MD   Scribe for today's visit: Christoper FabianMolly Weber, ATC    SUBJECTIVE:  Michael Sullivan is here for New Patient (Initial Visit) (R shoulder pain) .   His R shoulder symptoms INITIALLY: Began a week ago and with no specific MOI that he can recall (worked out at [employer] gym and then played basketball for a while after) Described as 4/10 aching, intermittent pain, nonradiating Worsened with horizontal aBd at shoulder level Improved with heat and ice. Additional associated symptoms include: none noted    At this time symptoms are improving compared to onset with decreased pain intensity. He has been using ice and heat intermittently.   ROS Denies night time disturbances. Denies fevers, chills, or night sweats. Denies unexplained weight loss. Denies personal history of cancer. Denies changes in bowel or bladder habits. Denies recent unreported falls. Denies new or worsening dyspnea or wheezing. Denies headaches or dizziness.  Denies numbness, tingling or weakness  In the extremities.  Denies dizziness or presyncopal episodes Denies lower extremity edema     HISTORY & PERTINENT PRIOR DATA:  Prior History reviewed and updated per electronic medical record.  Significant history, findings, studies and interim changes include:  reports that  has never smoked. he has never used smokeless tobacco. No results for input(s): HGBA1C, LABURIC, CREATINE in the last 8760 hours. No specialty comments available. No problems updated.  OBJECTIVE:  VS:  HT:6\' 3"  (190.5 cm)   WT:190 lb 9.6 oz (86.5 kg)  BMI:23.82    BP:112/78  HR:(!) 58bpm  TEMP: ( )  RESP:99 %   PHYSICAL EXAM: Constitutional: WDWN, Non-toxic appearing. Psychiatric: Alert &  appropriately interactive. Not depressed or anxious appearing. Respiratory: No increased work of breathing. Trachea Midline Eyes: Pupils are equal. EOM intact without nystagmus. No scleral icterus Cardiovascular:  Peripheral Pulses: peripheral pulses symmetrical No clubbing or cyanosis appreciated Capillary Refill is normal, less than 2 seconds No signficant generalized edema/anasarca Sensory Exam: intact to light touch  Right Shoulder Exam: Normal alignment & Contours Skin: No overlying erythema/ecchymosis Neck: normal range of motion and supple Axial loading and circumduction produces: No pain or crepitation  Non tender to palpation over: Bony Landmarks TTP over: none Drop arm test: negative Hawkins: normal, no pain  Neers: normal, no pain   Internal Rotation:  ROM: Normal with no pain Strength: Normal External Rotation:  ROM: Normal with no pain Strength: Normal  Empty can: normal, no pain Strength: Normal Speeds: normal, no pain Strength: Normal O'Briens: normal, no pain Strength: Normal  Positive for dyskinesis, elevated right shoulder at rest no significant scapular winging.   Findings:  PROCEDURE NOTE : OSTEOPATHIC MANIPULATION The decision today to treat with Osteopathic Manipulative Therapy (OMT) was based on physical exam findings. Verbal consent was obtained after after explanation of risks, benefits and potential side effects, including acute pain flare, post manipulation soreness and need for repeat treatments.   Additional time was spent discussing the minimal risk of  injury to neurovascular structures for associated Cervical manipulation.  After verbal consent was obtained manipulation was performed as below:  Contraindications to OMT reviewed and include: NONE.             Regions treated: Per examined regions as below  and associated billing codes          Techniques used: [employer], MFR, HVLA and ART The patient tolerated the treatment well and reported  Improved symptoms following treatment today. Patient was given medications, exercises, stretches and lifestyle modifications per AVS and verbally.     OSTEOPATHIC/STRUCTURAL EXAM FINDINGS:    C2 through C4 FRS right Elevated first right rib T2 through T4 neutral rotated left, side bent right T5 FRS right    ++++++++++++++++++++++++++++++++++++++++++++  PROCEDURE NOTE: THERAPEUTIC EXERCISES (97110) 15 minutes spent for Therapeutic exercises as below and as referenced in the AVS. This included exercises focusing on stretching, strengthening, with significant focus on eccentric aspects.  Proper technique shown and discussed handout in great detail with ATC. All questions were discussed and answered.   Long term goals include an improvement in range of motion, strength, endurance as well as avoiding reinjury. Frequency of visits is one time as determined during today's  office visit. Frequency of exercises to be performed is as per handout.  EXERCISES REVIEWED: Scapular stabilization and intrinsic rotator cuff Posterior chain     ASSESSMENT & PLAN:   1. Pain in joint of right shoulder   2. Somatic dysfunction of cervical region   3. Somatic dysfunction of thoracic region   4. Somatic dysfunction of rib cage region   5. Scapular dyskinesis    PLAN: Underlying scapular dyskinesis with anterior chain dominance. Low suspicion for true intrinsic shoulder issue.  Patient tolerated osteopathic manipulation very well and will begin working on home therapeutic exercises both of which are documented as above.  If any lack of improvement I am happy to see him back but otherwise we will plan to see him only as needed.   ++++++++++++++++++++++++++++++++++++++++++++ Orders & Meds: No orders of the defined types were placed in this encounter.   No orders of the defined types were placed in this encounter.   ++++++++++++++++++++++++++++++++++++++++++++ Follow-up: Return if symptoms worsen  or fail to improve.   Pertinent documentation may be included in additional procedure notes, imaging studies, problem based documentation and patient instructions. Please see these sections of the encounter for additional information regarding this visit. CMA/ATC served as Neurosurgeonscribe during this visit. History, Physical, and Plan performed by medical provider. Documentation and orders reviewed and attested to.      Andrena MewsMichael D Rigby, DO    [facility] Physician

## 2017-10-23 ENCOUNTER — Encounter: Payer: Self-pay | Admitting: Sports Medicine

## 2018-02-13 DIAGNOSIS — H7201 Central perforation of tympanic membrane, right ear: Secondary | ICD-10-CM | POA: Diagnosis not present

## 2018-02-13 DIAGNOSIS — H9011 Conductive hearing loss, unilateral, right ear, with unrestricted hearing on the contralateral side: Secondary | ICD-10-CM | POA: Diagnosis not present

## 2018-03-08 DIAGNOSIS — H5043 Accommodative component in esotropia: Secondary | ICD-10-CM | POA: Diagnosis not present

## 2018-03-12 ENCOUNTER — Telehealth: Payer: Self-pay | Admitting: Family Medicine

## 2018-03-12 NOTE — Telephone Encounter (Signed)
If he is looking for travel advice on immunizations- we need to have him in for a visit or get him into the travel clinic- this requires me to look over CDC recommendations and go over with patient. I usually refer to travel clinic but since so close to his trip- we can try to do it here. He needs to know locations he is going in country.

## 2018-03-12 NOTE — Telephone Encounter (Signed)
See note

## 2018-03-12 NOTE — Telephone Encounter (Signed)
Patient calling to check up on the immunizations he will need. He is requesting a print out of his immunization records also. CB#: [phone]

## 2018-03-12 NOTE — Telephone Encounter (Signed)
Copied from CRM [id number]. Topic: Quick Communication - See Telephone Encounter >> Mar 12, 2018  9:16 AM Oneal Grout wrote: CRM for notification. See Telephone encounter for: 03/12/18. Patient going out the country and would like to know what injections he will need. Leaving 03/25/18 to go to [place].

## 2018-03-12 NOTE — Telephone Encounter (Signed)
Patient has had Hep A (06/07/2007, 02/21/2014) and Hep B (02/05/1996, 10/02/1996, 02/11/2000)  Any other recommendations?

## 2018-03-12 NOTE — Telephone Encounter (Signed)
Please advise 

## 2018-03-13 NOTE — Telephone Encounter (Signed)
Noted. I called patient to get him scheduled for a office visit. Patient did not answer, I left a voicemail stating to call office back to get scheduled.

## 2018-03-15 NOTE — Telephone Encounter (Signed)
Called and left a voicemail message asking patient to return my phone call and schedule an appointment to discuss immunizations

## 2018-03-16 ENCOUNTER — Encounter: Payer: Self-pay | Admitting: Internal Medicine

## 2018-03-16 ENCOUNTER — Ambulatory Visit (INDEPENDENT_AMBULATORY_CARE_PROVIDER_SITE_OTHER): Payer: 59 | Admitting: Internal Medicine

## 2018-03-16 DIAGNOSIS — Z298 Encounter for other specified prophylactic measures: Secondary | ICD-10-CM

## 2018-03-16 DIAGNOSIS — Z23 Encounter for immunization: Secondary | ICD-10-CM

## 2018-03-16 DIAGNOSIS — Z9189 Other specified personal risk factors, not elsewhere classified: Secondary | ICD-10-CM | POA: Diagnosis not present

## 2018-03-16 DIAGNOSIS — Z789 Other specified health status: Secondary | ICD-10-CM

## 2018-03-16 DIAGNOSIS — Z7184 Encounter for health counseling related to travel: Secondary | ICD-10-CM

## 2018-03-16 DIAGNOSIS — Z7189 Other specified counseling: Secondary | ICD-10-CM

## 2018-03-16 DIAGNOSIS — Z7185 Encounter for immunization safety counseling: Secondary | ICD-10-CM

## 2018-03-16 MED ORDER — MEFLOQUINE HCL 250 MG PO TABS: 250.0000 mg | ORAL_TABLET | ORAL | 0 refills | Status: DC

## 2018-03-16 MED ORDER — AZITHROMYCIN 500 MG PO TABS: 1000.0000 mg | ORAL_TABLET | Freq: Once | ORAL | 0 refills | Status: DC

## 2018-03-16 MED ORDER — AZITHROMYCIN 500 MG PO TABS: 1000.0000 mg | ORAL_TABLET | Freq: Once | ORAL | 0 refills | Status: AC

## 2018-03-16 MED FILL — AZITHROMYCIN 500 MG TABLET: 500 | 2 days supply | Qty: 4 | Fill #0

## 2018-03-16 MED FILL — MEFLOQUINE HCL 250 MG TAB: 250 | 9 days supply | Qty: 9 | Fill #0

## 2018-03-16 NOTE — Patient Instructions (Signed)
[facility]                [address], [address]                   [place], [place] [address]                      Phone: [phone]                        Fax: [phone]   Planned departure date: March 25, 2018          Planned return date: June 27 Countries of travel: [place] , [place] and [place]   Guidelines for the Prevention & Treatment of Traveler's Diarrhea  Prevention: "Boil it, Peel it, [place] it, or Forget it"   the fewer chances -> lower risk: try to stick to food & water precautions as much as possible"   If it's "piping hot"; it is probably okay, if not, it may not be   Treatment   1) You should always take care to drink lots of fluids in order to avoid dehydration   2) You should bring medications with you in case you come down with a case of diarrhea   3) OTC = bring pepto-bismol - can take with initial abdominal symptoms;                    Imodium - can help slow down your intestinal tract, can help relief cramps                    and diarrhea, can take if no bloody diarrhea  Use azithromycin if needed for traveler's diarrhea  Guidelines for the Prevention of Malaria  Avoidance:  -fewer mosquito bites = lower risk. Mosquitos can bite at night as well as daytime  -cover up (long sleeve clothing), mosquito nets, screens  -Insect repellent for your skin ( DEET containing lotion > 20%): for clothes ( permethrin spray)   2 days prior to travel, start mefloquine, weekly dose starting one week before entering endemic area, ending 4 weeks after leaving area for malaria prevention.   Immunizations received today: JEV, Td and Typhoid (parenteral)  Future immunizations, if indicated JEV   Prior to travel:  1) Be sure to pick up appropriate prescriptions, including medicine you take daily. Do not expect to be able to fill your prescriptions abroad.  2) Strongly consider obtaining traveler's insurance,  including emergency evacuation insurance. Most plans in the [place] do not cover participants abroad. (see below for resources)  3) Register at the appropriate [place] embassy or consulate with travel dates so they are aware of your presence in-country and for helpful advice during travel using the [employer] (STEP, GuyGalaxy.si).  4) Leave contact information with a relative or friend.  5) Keep a Corporate treasurer, credit cards in case they become lost or stolen  6) Inform your credit card company that you will be travelling abroad   During travel:  1) If you become ill and need medical advice, the [place] [employer] of the country you are traveling in general provides a list of English speaking doctors.  We are also available on MyChart for remote consultation if you register prior to travel. 2) Avoid motorcycles or scooters when at all possible. Traffic laws in many countries are lax and accidents  occur frequently.  3) Do not take any unnecessary risks that you wouldn't do at home.   Resources:  -Country specific information: BlindResource.ca or GreenNylon.com.cy  -Press photographer (DEET, mosquito nets): REI, [employer] store, [employer], Gall[employer] insurance options: gatewayplans.com; http://clayton-rivera.info/; travelguard.com or Good Pilgr[employer]'s Pride, gninsurance.com or info@gninsurance .com, [id number].   Post Travel:  If you return from your trip ill, call your primary care doctor or our travel clinic @ [phone].   Enjoy your trip and know that with proper pre-travel preparation, most people have an enjoyable and uninterrupted trip!

## 2018-03-16 NOTE — Progress Notes (Signed)
Patient ID: Michael Sullivan, male   DOB: [birth year], 22 y.o.   MRN: [id number] Subjective:   Michael Sullivan is a 22 y.o. male who presents to the Infectious Disease clinic for travel consultation. Planned departure date: March 25, 2018          Planned return date: June 27 Countries of travel: [place] , [place] and [place] Areas in country: urban   Accommodations: hostels Purpose of travel: vacation Prior travel out of [place]: yes     Objective:   Medications: reviewed    Assessment:   No contraindications to travel. none     Plan:    Issues discussed: environmental concerns, freshwater swimming, future shots, insect-borne illnesses, Japanese encephalitis, malaria, MVA safety, rabies, safe food/water, traveler's diarrhea, website/handouts for more information, what to do if ill upon return and what to do if ill while there. Immunizations recommended: JEV, Td and Typhoid (parenteral). Malaria prophylaxis: mefloquine, weekly dose starting one week before entering endemic area, ending 4 weeks after leaving area Traveler's diarrhea prophylaxis: azithromycin. Total duration of visit: 1 Hour. Total time spent on education, counseling, coordination of care: 30 Minutes.

## 2018-03-18 ENCOUNTER — Encounter (HOSPITAL_COMMUNITY): Payer: Self-pay | Admitting: Emergency Medicine

## 2018-03-18 ENCOUNTER — Ambulatory Visit (HOSPITAL_COMMUNITY)
Admission: EM | Admit: 2018-03-18 | Discharge: 2018-03-18 | Disposition: A | Discharge status: Home / Self Care | Payer: 59 | Source: Home / Self Care

## 2018-03-18 ENCOUNTER — Emergency Department (HOSPITAL_COMMUNITY)
Admission: EM | Admit: 2018-03-18 | Discharge: 2018-03-19 | Disposition: A | Discharge status: Home / Self Care | Payer: 59 | Attending: Emergency Medicine | Admitting: Emergency Medicine

## 2018-03-18 DIAGNOSIS — R109 Unspecified abdominal pain: Secondary | ICD-10-CM | POA: Diagnosis not present

## 2018-03-18 DIAGNOSIS — R100 Acute abdomen: Secondary | ICD-10-CM

## 2018-03-18 DIAGNOSIS — R1030 Lower abdominal pain, unspecified: Secondary | ICD-10-CM

## 2018-03-18 DIAGNOSIS — R197 Diarrhea, unspecified: Secondary | ICD-10-CM | POA: Diagnosis not present

## 2018-03-18 DIAGNOSIS — R110 Nausea: Secondary | ICD-10-CM | POA: Diagnosis not present

## 2018-03-18 DIAGNOSIS — R1084 Generalized abdominal pain: Secondary | ICD-10-CM | POA: Diagnosis not present

## 2018-03-18 LAB — COMPREHENSIVE METABOLIC PANEL
ALK PHOS: 73 U/L (ref 38–126)
ALT: 15 U/L — AB (ref 17–63)
AST: 19 U/L (ref 15–41)
Albumin: 4.1 g/dL (ref 3.5–5.0)
Anion gap: 7 (ref 5–15)
BUN: 7 mg/dL (ref 6–20)
CALCIUM: 8.8 mg/dL — AB (ref 8.9–10.3)
CO2: 28 mmol/L (ref 22–32)
CREATININE: 0.93 mg/dL (ref 0.61–1.24)
Chloride: 103 mmol/L (ref 101–111)
GFR calc non Af Amer: >60 mL/min (ref >60)
Glucose, Bld: 80 mg/dL (ref 65–99)
Potassium: 3.9 mmol/L (ref 3.5–5.1)
SODIUM: 138 mmol/L (ref 135–145)
Total Bilirubin: 0.6 mg/dL (ref 0.3–1.2)
Total Protein: 7 g/dL (ref 6.5–8.1)

## 2018-03-18 LAB — URINALYSIS, ROUTINE W REFLEX MICROSCOPIC
BACTERIA UA: NONE SEEN
Bilirubin Urine: NEGATIVE
GLUCOSE, UA: NEGATIVE mg/dL
Ketones, ur: NEGATIVE mg/dL
LEUKOCYTES UA: NEGATIVE
Nitrite: NEGATIVE
PROTEIN: NEGATIVE mg/dL
Specific Gravity, Urine: 1.021 (ref 1.005–1.030)
pH: 5 (ref 5.0–8.0)

## 2018-03-18 LAB — CBC
HEMATOCRIT: 41.5 % (ref 39.0–52.0)
HEMOGLOBIN: 13.6 g/dL (ref 13.0–17.0)
MCH: 30.1 pg (ref 26.0–34.0)
MCHC: 32.8 g/dL (ref 30.0–36.0)
MCV: 91.8 fL (ref 78.0–100.0)
PLATELETS: 208 10*3/uL (ref 150–400)
RBC: 4.52 MIL/uL (ref 4.22–5.81)
RDW: 12.5 % (ref 11.5–15.5)
WBC: 5.8 10*3/uL (ref 4.0–10.5)

## 2018-03-18 LAB — LIPASE, BLOOD: Lipase: 29 U/L (ref 11–51)

## 2018-03-18 MED ORDER — PREDNISONE 10 MG PO TABS: 10.0000 mg | ORAL_TABLET | Freq: Once | ORAL | Status: DC

## 2018-03-18 MED ORDER — SODIUM CHLORIDE 0.9 % IV SOLN
Freq: Once | INTRAVENOUS | Status: AC
  Administered 2018-03-18: 23:00:00 via INTRAVENOUS

## 2018-03-18 NOTE — ED Triage Notes (Signed)
Pt c/o LLQ abdominal pain and tenderness x3 days. C/o nausea with some diarrhea.

## 2018-03-18 NOTE — ED Provider Notes (Signed)
[facility] EMERGENCY DEPARTMENT Provider Note   CSN: [id number] Arrival date & time: 03/18/18  2037     History   Chief Complaint Chief Complaint  Patient presents with  . Abdominal Pain    HPI Michael LLERENA is a 22 y.o. male here for evaluation of abdominal discomfort onset Thursday night, gradually worsening.  Pain is described as a dull discomfort.  Pain is localized to diffuse lower abdomen mostly under umbilicus.  Pain is mild to moderate.  Associated symptoms include decreased appetite, chills, nausea.  Had preceding diarrhea, now feeling like he has to have a bowel movement but nothing is coming out.  He has not been eating a lot and he attributes decreased bowel movements to this.  Attempted Pepto-Bismol and Prilosec without relief.  No history of abdominal surgeries.  No history of kidney stones, UTI.  He denies fevers, dysuria, hematuria, melena, hematochezia, flank pain, penile discharge, testicular pain.  Was seen in the urgent care PTA and told that he needed to be ruled out for appendicitis.  Last PO intake > 4 hours before arrival.  HPI  Past Medical History:  Diagnosis Date  . 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 History of concussion 08/2013  . History of esophageal reflux    as a child    Patient Active Problem List   Diagnosis Date Noted  . GERD (gastroesophageal reflux disease)   . Pilonidal cyst 05/22/2013    Past Surgical History:  Procedure Laterality Date  . ADENOIDECTOMY AND MYRINGOTOMY WITH TUBE PLACEMENT  2001  . CHOLESTEATOMA EXCISION Right 09/19/2007  . PILONIDAL CYST EXCISION    . STRABISMUS SURGERY Right 03/17/2017   Procedure: REPAIR STRABISMUS RIGHT EYE;  Surgeon: Verne Carrow, MD;  Location: [facility];  Service: Ophthalmology;  Laterality: Right;  . TONSILLECTOMY  05/20/2002  . TYMPANOPLASTY WITH GRAFT Right 09/19/2007; 05/27/2011  . WISDOM TOOTH EXTRACTION          Home Medications    Prior to  Admission medications   Medication Sig Start Date End Date Taking? Authorizing Provider  ondansetron (ZOFRAN ODT) 4 MG disintegrating tablet Take 1 tablet (4 mg total) by mouth every 8 (eight) hours as needed for nausea or vomiting. 03/19/18   Liberty Handy, PA-C    Family History Family History  Problem Relation Age of Onset  . Hashimoto's thyroiditis Mother        now on thyroid medicine  . Kidney Stones Father   . Breast cancer Maternal Grandmother        breast  . Melanoma Paternal Grandfather   . Diabetes Paternal Grandfather     Social History Social History   Tobacco Use  . Smoking status: Never Smoker  . Smokeless tobacco: Never Used  Substance Use Topics  . Alcohol use: Yes    Comment: occasionally  . Drug use: No     Allergies   Patient has no known allergies.   Review of Systems Review of Systems  Constitutional: Positive for appetite change and chills.  Gastrointestinal: Positive for abdominal pain and nausea.  All other systems reviewed and are negative.    Physical Exam Updated Vital Signs BP 130/71   Pulse (!) 58   Temp 98.2 F (36.8 C) (Oral)   Resp 14   Ht  (1.905 m)   SpO2 100%   BMI 23.82 kg/m   Physical Exam  Constitutional: He is oriented to person, place, and time. He appears well-developed  and well-nourished. No distress.  Non toxic.  HENT:  Head: Normocephalic and atraumatic.  Nose: Nose normal.  Moist mucous membranes   Eyes: Pupils are equal, round, and reactive to light. Conjunctivae and EOM are normal.  Neck: Normal range of motion.  Cardiovascular: Normal rate, regular rhythm, normal heart sounds and intact distal pulses.  No murmur heard. 2+ DP and radial pulses bilaterally. No LE edema.   Pulmonary/Chest: Effort normal and breath sounds normal.  Abdominal: Soft. Bowel sounds are normal. There is tenderness in the right lower quadrant, suprapubic area and left lower quadrant.  Mild, diffuse lower abdominal pain  worse left infraumbilical area.  Positive McBurney's (very mild). Positive Rovsing's. No G/R/R. No CVA tenderness. Negative Murphy's. Soft. ND. Active BS.   Musculoskeletal: Normal range of motion.  Neurological: He is alert and oriented to person, place, and time.  Skin: Skin is warm and dry. Capillary refill takes less than 2 seconds.  Psychiatric: He has a normal mood and affect. His behavior is normal. Judgment and thought content normal.  Nursing note and vitals reviewed.    ED Treatments / Results  Labs (all labs ordered are listed, but only abnormal results are displayed) Labs Reviewed  COMPREHENSIVE METABOLIC PANEL - Abnormal; Notable for the following components:      Result Value   Calcium 8.8 (*)    ALT 15 (*)    All other components within normal limits  URINALYSIS, ROUTINE W REFLEX MICROSCOPIC - Abnormal; Notable for the following components:   APPearance HAZY (*)    Hgb urine dipstick SMALL (*)    All other components within normal limits  LIPASE, BLOOD  CBC    EKG None  Radiology Ct Abdomen Pelvis W Contrast  Result Date: 03/19/2018 CLINICAL DATA:  Acute onset of left lower quadrant abdominal pain and tenderness. Nausea and diarrhea. EXAM: CT ABDOMEN AND PELVIS WITH CONTRAST TECHNIQUE: Multidetector CT imaging of the abdomen and pelvis was performed using the standard protocol following bolus administration of intravenous contrast. CONTRAST:  OMNIPAQUE IOHEXOL 300 MG/ML  SOLN COMPARISON:  None. FINDINGS: Lower chest: The visualized lung bases are grossly clear. The visualized portions of the mediastinum are unremarkable. Hepatobiliary: The liver is unremarkable in appearance. The gallbladder is unremarkable in appearance. The common bile duct remains normal in caliber. Pancreas: The pancreas is within normal limits. Spleen: The spleen is unremarkable in appearance. Adrenals/Urinary Tract: The adrenal glands are unremarkable in appearance. A nonobstructing 4 mm  stone is noted at the interpole region of the right kidney. There is no evidence of hydronephrosis. No obstructing ureteral stones are identified. No perinephric stranding is seen. Stomach/Bowel: The stomach is unremarkable in appearance. The small bowel is within normal limits. The appendix is normal in caliber, without evidence of appendicitis. Mild wall thickening is suggested along the distal transverse and descending colon, concerning for an infectious or inflammatory process. Vascular/Lymphatic: The abdominal aorta is unremarkable in appearance. The inferior vena cava is grossly unremarkable. No retroperitoneal lymphadenopathy is seen. No pelvic sidewall lymphadenopathy is identified. Reproductive: The bladder is mildly distended and grossly unremarkable. The prostate remains normal in size. Other: No additional soft tissue abnormalities are seen. Musculoskeletal: No acute osseous abnormalities are identified. The visualized musculature is unremarkable in appearance. IMPRESSION: 1. Mild wall thickening suggested along the distal transverse and descending colon, concerning for an infectious or inflammatory process. 2. Nonobstructing 4 mm stone at the interpole region of the right kidney. Electronically Signed   By: Roanna Raider  M.D.   On: 03/19/2018 00:27    Procedures Procedures (including critical care time)  Medications Ordered in ED Medications  0.9 %  sodium chloride infusion ( Intravenous Stopped 03/19/18 0039)  iohexol (OMNIPAQUE) 300 MG/ML solution 100 mL (100 mLs Intravenous Contrast Given 03/19/18 0009)     Initial Impression / Assessment and Plan / ED Course  I have reviewed the triage vital signs and the nursing notes.  Pertinent labs & imaging results that were available during my care of the patient were reviewed by me and considered in my medical decision making (see chart for details).  Clinical Course as of Mar 19 53  Mon Mar 19, 2018  0035 IMPRESSION: 1. Mild wall  thickening suggested along the distal transverse and descending colon, concerning for an infectious or inflammatory process. 2. Nonobstructing 4 mm stone at the interpole region of the right kidney.   Electronically Signed By: Roanna Raider M.D. On: 03/19/2018 00:27  CT ABDOMEN PELVIS W CONTRAST [CG]  0036 Repeat abd exam reassuring. Mild diffuse lower abd tenderness as before.    [CG]    Clinical Course User Index [CG] Liberty Handy, PA-C   ddx include viral gastroenteritis, appendicitis less likely diverticulitis. No urinary symptoms, CVAT to suggest UTI/pyelo or kidney stone. Doubt GU etiology.   Labs and urine unremarkable. Pending CTAP. Final Clinical Impressions(s) / ED Diagnoses   [phone]: CTAP shows mild thickening of colon, no appendicitis. Repeat abd exam reassuring. No episodes of emesis in ER. Tolerating PO. VSS. Will dc with symptomatic tx for suspected viral gastroenteritis. Discussed return precautions. Pt and father at bedside in agreement.  Final diagnoses:  Lower abdominal pain    ED Discharge Orders        Ordered    ondansetron (ZOFRAN ODT) 4 MG disintegrating tablet  Every 8 hours PRN     03/19/18 0039       Liberty Handy, PA-C 03/19/18 1191    Azalia Bilis, MD 03/19/18 [phone]

## 2018-03-18 NOTE — ED Provider Notes (Signed)
[facility] CARE CENTER   [id number] 03/18/18 Arrival Time: 1925   SUBJECTIVE:  Michael Sullivan is a 22 y.o. male who presents to the urgent care with complaint of worsening abdominal pain over 4 days. The pain is gotten particularly bad over the last 48 hours during which the patient has lost his appetite and has remained nauseated. Does not have bowel movement 48 hours. Initially had some diarrhea but this is resolved.  Patient received 2 vaccinations for foreign travel to the [place] area 2 days ago.  Patient's had no vomiting or fever.    Past Medical History:  Diagnosis Date  . 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 History of concussion 08/2013  . History of esophageal reflux    as a child   Family History  Problem Relation Age of Onset  . Hashimoto's thyroiditis Mother        now on thyroid medicine  . Kidney Stones Father   . Breast cancer Maternal Grandmother        breast  . Melanoma Paternal Grandfather   . Diabetes Paternal Grandfather    Social History   Socioeconomic History  . Marital status: Single    Spouse name: Not on file  . Number of children: Not on file  . Years of education: Not on file  . Highest education level: Not on file  Occupational History  . Not on file  Social Needs  . Financial resource strain: Not on file  . Food insecurity:    Worry: Not on file    Inability: Not on file  . Transportation needs:    Medical: Not on file    Non-medical: Not on file  Tobacco Use  . Smoking status: Never Smoker  . Smokeless tobacco: Never Used  Substance and Sexual Activity  . Alcohol use: Yes    Comment: occasionally  . Drug use: No  . Sexual activity: Not on file  Lifestyle  . Physical activity:    Days per week: Not on file    Minutes per session: Not on file  . Stress: Not on file  Relationships  . Social connections:    Talks on phone: Not on file    Gets together: Not on file    Attends religious service: Not on file   Active member of club or organization: Not on file    Attends meetings of clubs or organizations: Not on file    Relationship status: Not on file  . Intimate partner violence:    Fear of current or ex partner: Not on file    Emotionally abused: Not on file    Physically abused: Not on file    Forced sexual activity: Not on file  Other Topics Concern  . Not on file  Social History Narrative   Single.       [employer]. Studying Business. Thinking about sports medicine. Rising junior fall 2017.    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   No Known Allergies    ROS: As per HPI, remainder of ROS negative.   OBJECTIVE:   Vitals:   03/18/18 1939  BP: 115/67  Pulse: 62  Resp: 16  Temp: 98 F (36.7 C)  SpO2: 100%     General appearance: alert; no distress Eyes: PERRL; EOMI; conjunctiva normal HENT: normocephalic; atraumatic;  oral mucosa  normal Neck: supple Lungs: clear to auscultation bilaterally Heart: regular rate and rhythm Abdomen: soft, diffusely tender-more so around the umbilicus; bowel sounds absent; no masses or organomegaly; mild rebound tenderness present Back: no CVA tenderness Extremities: no cyanosis or edema; symmetrical with no gross deformities Skin: warm and dry Neurologic: normal gait; grossly normal Psychological: alert and cooperative; normal mood and affect      Labs:  Results for orders placed or performed in visit on 03/11/14  Comprehensive metabolic panel  Result Value Ref Range   Glucose 77 65 - 99 mg/dL   BUN 11 6 - 20 mg/dL   Creatinine, Ser 1.61 0.76 - 1.27 mg/dL   GFR calc non Af Amer 128 >59 mL/min/1.73   GFR calc Af Amer 148 >59 mL/min/1.73   BUN/Creatinine Ratio 13 8 - 19   Sodium 142 134 - 144 mmol/L   Potassium 4.3 3.5 - 5.2 mmol/L   Chloride 102 97 - 108 mmol/L   CO2 26 18 - 29 mmol/L     Calcium 9.5 8.7 - 10.2 mg/dL   Total Protein 7.2 6.0 - 8.5 g/dL   Albumin 5.1 3.5 - 5.5 g/dL   Globulin, Total 2.1 1.5 - 4.5 g/dL   Albumin/Globulin Ratio 2.4 1.1 - 2.5   Total Bilirubin 0.6 0.0 - 1.2 mg/dL   Alkaline Phosphatase 77 56 - 127 IU/L   AST 21 0 - 40 IU/L   ALT 14 0 - 44 IU/L  Vit D  25 hydroxy (rtn osteoporosis monitoring)  Result Value Ref Range   Vit D, 25-Hydroxy 19.5 (L) 30.0 - 100.0 ng/mL  TSH  Result Value Ref Range   TSH 0.506 0.450 - 4.500 uIU/mL  T4, Free  Result Value Ref Range   Free T4 1.24 0.93 - 1.60 ng/dL  Prolactin  Result Value Ref Range   Prolactin 5.8 4.0 - 15.2 ng/mL  Hgb A1c w/o eAG  Result Value Ref Range   Hgb A1c MFr Bld 5.4 4.8 - 5.6 %    Labs Reviewed - No data to display  No results found.     ASSESSMENT & PLAN:  1. Acute abdomen     Meds ordered this encounter  Medications  . DISCONTD: predniSONE (DELTASONE) tablet 10 mg    Reviewed expectations re: course of current medical issues. Questions answered. Outlined signs and symptoms indicating need for more acute intervention. Patient verbalized understanding. After Visit Summary given.    Procedures:      Elvina Sidle, MD 03/18/18 2029

## 2018-03-18 NOTE — Discharge Instructions (Addendum)
You need to go directly to the Emergency Department for further evaluation  Do not eat or drink anything until the doctor there has evaluated you

## 2018-03-18 NOTE — ED Triage Notes (Addendum)
Reports abdominal pain for a couple of days. Worse for the last two days.  Was seen at [facility] today.  Was told to come here to r/o appendicitis.  Pain is at center of lower abdomen going across.  Also endorses bil low back pain.  Reports he hasn't eaten or drank much today.  Also c/o n/d.

## 2018-03-19 ENCOUNTER — Emergency Department (HOSPITAL_COMMUNITY): Payer: 59

## 2018-03-19 DIAGNOSIS — R197 Diarrhea, unspecified: Secondary | ICD-10-CM | POA: Diagnosis not present

## 2018-03-19 DIAGNOSIS — R1030 Lower abdominal pain, unspecified: Secondary | ICD-10-CM | POA: Diagnosis not present

## 2018-03-19 DIAGNOSIS — R109 Unspecified abdominal pain: Secondary | ICD-10-CM | POA: Diagnosis not present

## 2018-03-19 MED ORDER — ONDANSETRON 4 MG PO TBDP: 4.0000 mg | ORAL_TABLET | Freq: Three times a day (TID) | ORAL | 0 refills | Status: DC | PRN

## 2018-03-19 MED ORDER — IOHEXOL 300 MG/ML  SOLN
100.0000 mL | Freq: Once | INTRAMUSCULAR | Status: AC | PRN
  Administered 2018-03-19: 100 mL via INTRAVENOUS

## 2018-03-19 NOTE — Discharge Instructions (Addendum)
Work up in the emergency department was reassuring.  CT scan showed mild thickening of colon consistent with viral inflammatory and infectious cause.   Symptoms of uncomplicated viral gastrointestinal infections usually peak within 12-24 hours, and slowly improve after 3-5 days. Mainstay of treatment includes symptoms control, oral hydration, prevention of spread of illness.    Zofran for nausea every 6 hours for the next 24 hours to avoid vomiting and tolerate fluids. Acetaminophen (tylenol) for body aches, abdominal pain. Ibuprofen (advil, aleve) can be irritating to stomach, avoid these until you feel better. Stay well hydrated with 1-2 L of water daily. Start with bland, mild diet avoiding hot, spicy, greasy foods until you start to feel better and then can transition to normal diet. Avoid fruit or fruit juices as these can worsen diarrhea. Norovirus and other stomach bugs are killed by alcohol and standard cleaning agents. Wash hands often and do not share utensils or drinks to avoid spread to family members.  Return for worsening abdominal pain, inability to tolerating fluids by mouth despite nausea medication, localized abdominal pain, blood in vomit or stool.

## 2018-03-20 ENCOUNTER — Ambulatory Visit (INDEPENDENT_AMBULATORY_CARE_PROVIDER_SITE_OTHER): Payer: 59 | Admitting: Family Medicine

## 2018-03-20 ENCOUNTER — Encounter: Payer: Self-pay | Admitting: Family Medicine

## 2018-03-20 VITALS — BP 108/62 | HR 66 | Temp 98.3°F | Ht 75.0 in | Wt 189.0 lb

## 2018-03-20 DIAGNOSIS — N200 Calculus of kidney: Secondary | ICD-10-CM

## 2018-03-20 DIAGNOSIS — R1032 Left lower quadrant pain: Secondary | ICD-10-CM | POA: Diagnosis not present

## 2018-03-20 DIAGNOSIS — R197 Diarrhea, unspecified: Secondary | ICD-10-CM | POA: Diagnosis not present

## 2018-03-20 DIAGNOSIS — R110 Nausea: Secondary | ICD-10-CM | POA: Diagnosis not present

## 2018-03-20 MED FILL — PREVIDENT 5000 BOOSTER PLUS: 1.1 | 30 days supply | Qty: 100 | Fill #0

## 2018-03-20 NOTE — Progress Notes (Signed)
Subjective:  Michael Sullivan is a 22 y.o. year old very pleasant male patient who presents for/with See problem oriented charting ROS-admits he did have diarrhea- largely resolved- and nausea-lingering some .  He has had abdominal pain-this is improving.  No fever or chills.  Blood on tissue with wiping but only once.  Past Medical History-  Patient Active Problem List   Diagnosis Date Noted  . Kidney stone 03/20/2018  . GERD (gastroesophageal reflux disease)   . Pilonidal cyst 05/22/2013    Medications- reviewed and updated No current outpatient medications on file.   No current facility-administered medications for this visit.     Objective: BP 108/62 (BP Location: Left Arm, Patient Position: Sitting, Cuff Size: Large)   Pulse 66   Temp 98.3 F (36.8 C) (Oral)   Ht  (1.905 m)   Wt 189 lb (85.7 kg)   SpO2 96%   BMI 23.62 kg/m  Gen: NAD, resting comfortably CV: RRR no murmurs rubs or gallops Lungs: CTAB no crackles, wheeze, rhonchi Abdomen: soft/mildly tender throughout the lower abdomen but more on the left/nondistended/normal bowel sounds. No rebound or guarding.  Ext: no edema Skin: warm, dry  Ct Abdomen Pelvis W Contrast  Result Date: 03/19/2018 CLINICAL DATA:  Acute onset of left lower quadrant abdominal pain and tenderness. Nausea and diarrhea. EXAM: CT ABDOMEN AND PELVIS WITH CONTRAST TECHNIQUE: Multidetector CT imaging of the abdomen and pelvis was performed using the standard protocol following bolus administration of intravenous contrast. CONTRAST:  OMNIPAQUE IOHEXOL 300 MG/ML  SOLN COMPARISON:  None. FINDINGS: Lower chest: The visualized lung bases are grossly clear. The visualized portions of the mediastinum are unremarkable. Hepatobiliary: The liver is unremarkable in appearance. The gallbladder is unremarkable in appearance. The common bile duct remains normal in caliber. Pancreas: The pancreas is within normal limits. Spleen: The spleen is unremarkable  in appearance. Adrenals/Urinary Tract: The adrenal glands are unremarkable in appearance. A nonobstructing 4 mm stone is noted at the interpole region of the right kidney. There is no evidence of hydronephrosis. No obstructing ureteral stones are identified. No perinephric stranding is seen. Stomach/Bowel: The stomach is unremarkable in appearance. The small bowel is within normal limits. The appendix is normal in caliber, without evidence of appendicitis. Mild wall thickening is suggested along the distal transverse and descending colon, concerning for an infectious or inflammatory process. Vascular/Lymphatic: The abdominal aorta is unremarkable in appearance. The inferior vena cava is grossly unremarkable. No retroperitoneal lymphadenopathy is seen. No pelvic sidewall lymphadenopathy is identified. Reproductive: The bladder is mildly distended and grossly unremarkable. The prostate remains normal in size. Other: No additional soft tissue abnormalities are seen. Musculoskeletal: No acute osseous abnormalities are identified. The visualized musculature is unremarkable in appearance. IMPRESSION: 1. Mild wall thickening suggested along the distal transverse and descending colon, concerning for an infectious or inflammatory process. 2. Nonobstructing 4 mm stone at the interpole region of the right kidney. Electronically Signed   By: Roanna Raider M.D.   On: 03/19/2018 00:27   Assessment/Plan:  Abdominal pain/nausea/diarrhea S: Patient seen at urgent care on 03/18/18 noted to have worsening abdominal pain over 4 days prior. Over 48 hours before presentation- constantly nauseous, low appetite. Last BM was 48 hours prior- had diarrhea prior to that. Sent to ER. They described pain in both RLQ and LLQ mild to moderate in severity. pepto bismol and prilosec withotu relief.   In ER had CT scan which showed no appendicitis but some mild thickening of colon.  Thought to be likely viral gastroenteritis. Did have a right  sided kidney stone but nonobstructing and only 4 mm.   Patient states he has significantly improved in the last 2 days.  He has had no further diarrhea.  Has some intermittent lower abdominal pain but this is much improved from previous.  He does tell me he had blood with wiping once but this was around the time of frequent bowel movements.  He also has an interesting finding on his skin-has small pits in his skin that he developed when he was a child and had a stomach illness and he has noted several new smaller indentations.    Still with low appetite but is been able to eat some toast And other small things A/P: I suspect patient had a viral gastroenteritis is improving at this point.  I told him with the CT findings of mild wall thickening as well as the blood in stool (but please note only one episodes). - that inflammatory bowel disease was on the differential but that I did not strongly suspect this.  If he were to have recurrent episodes we would investigate further.  Kidney stone From CT scan "2. Nonobstructing 4 mm stone at the interpole region of the right kidney. " Patient reports intermittent pain in right CVA area up to 6-7/10 that seems to come in waves when it does occur. Now knowing he has a kidney stone on that time- he would like to get urology's opinion given intermittent pain. I will refer him today per his request (he will be out of country for next month though)   Lab/Order associations: Kidney stone - Plan: Ambulatory referral to Urology  Return precautions advised- plan for Friday visit if not continuing to improve by Thursday. Could get Hemoccult-once again being bleeding came from wiping though.   Tana Conch, MD

## 2018-03-20 NOTE — Assessment & Plan Note (Signed)
From CT scan "2. Nonobstructing 4 mm stone at the interpole region of the right kidney. " Patient reports intermittent pain in right CVA area up to 6-7/10 that seems to come in waves when it does occur. Now knowing he has a kidney stone on that time- he would like to get urology's opinion given intermittent pain. I will refer him today per his request (he will be out of country for next month though)

## 2018-03-20 NOTE — Patient Instructions (Addendum)
I am glad you are improving on a daily basis  My hope is this is just a viral illness causing your symptoms. I am glad you are already improving.   If you have recurrence of this in the next year or this fails to continue to improve I want to see you back

## 2018-03-21 NOTE — Telephone Encounter (Signed)
Patient was seen in office on 5/28/20219

## 2018-03-22 NOTE — Telephone Encounter (Signed)
Called and offered patient an 8:00am slot in the morning. He can't do this slot as he is moving back from [facility]. He said he would be back in town tomorrow afternoon and would call to see about coming in. He is aware he may be seeing a different provider

## 2018-03-22 NOTE — Telephone Encounter (Signed)
See note.   Copied from CRM [id number]. Topic: General - Other >> Mar 22, 2018  3:19 PM Trula Slade wrote: Reason for CRM:   Patient stated Dr. Durene Cal wanted him to come in on Friday, 03/23/18 if he wasn't feeling better because the patient is leaving out of town, but there are no open appts.  Please advise.

## 2018-05-24 DIAGNOSIS — R1084 Generalized abdominal pain: Secondary | ICD-10-CM | POA: Diagnosis not present

## 2018-05-24 DIAGNOSIS — N200 Calculus of kidney: Secondary | ICD-10-CM | POA: Diagnosis not present

## 2018-07-16 DIAGNOSIS — K51518 Left sided colitis with other complication: Secondary | ICD-10-CM | POA: Diagnosis not present

## 2018-07-16 DIAGNOSIS — N200 Calculus of kidney: Secondary | ICD-10-CM | POA: Diagnosis not present

## 2018-07-18 DIAGNOSIS — E291 Testicular hypofunction: Secondary | ICD-10-CM | POA: Diagnosis not present

## 2018-07-20 DIAGNOSIS — E559 Vitamin D deficiency, unspecified: Secondary | ICD-10-CM | POA: Diagnosis not present

## 2018-07-20 DIAGNOSIS — N209 Urinary calculus, unspecified: Secondary | ICD-10-CM | POA: Diagnosis not present

## 2018-07-20 DIAGNOSIS — R972 Elevated prostate specific antigen [PSA]: Secondary | ICD-10-CM | POA: Diagnosis not present

## 2018-07-30 DIAGNOSIS — N209 Urinary calculus, unspecified: Secondary | ICD-10-CM | POA: Diagnosis not present

## 2018-08-06 DIAGNOSIS — N200 Calculus of kidney: Secondary | ICD-10-CM | POA: Diagnosis not present

## 2018-08-06 DIAGNOSIS — K51518 Left sided colitis with other complication: Secondary | ICD-10-CM | POA: Diagnosis not present

## 2018-08-07 DIAGNOSIS — K51518 Left sided colitis with other complication: Secondary | ICD-10-CM | POA: Diagnosis not present

## 2018-08-07 DIAGNOSIS — N200 Calculus of kidney: Secondary | ICD-10-CM | POA: Diagnosis not present

## 2018-08-13 DIAGNOSIS — R82991 Hypocitraturia: Secondary | ICD-10-CM | POA: Diagnosis not present

## 2018-08-13 DIAGNOSIS — N200 Calculus of kidney: Secondary | ICD-10-CM | POA: Diagnosis not present

## 2018-08-13 DIAGNOSIS — K51518 Left sided colitis with other complication: Secondary | ICD-10-CM | POA: Diagnosis not present

## 2018-08-22 DIAGNOSIS — N200 Calculus of kidney: Secondary | ICD-10-CM | POA: Diagnosis not present

## 2018-09-05 DIAGNOSIS — N200 Calculus of kidney: Secondary | ICD-10-CM | POA: Diagnosis not present

## 2018-09-10 DIAGNOSIS — N200 Calculus of kidney: Secondary | ICD-10-CM | POA: Diagnosis not present

## 2018-10-23 IMAGING — CT CT ABD-PELV W/ CM
2 of 4 series · 16 of 46 positions shown, 18 images · IV contrast (Omni 300)
Comparison: None.

CLINICAL DATA: Acute onset of left lower quadrant abdominal pain
and tenderness. Nausea and diarrhea.

EXAM:
CT ABDOMEN AND PELVIS WITH CONTRAST
TECHNIQUE: Multidetector CT imaging of the abdomen and pelvis was performed
using the standard protocol following bolus administration of
intravenous contrast.
CONTRAST:  100mL OMNIPAQUE IOHEXOL 300 MG/ML  SOLN

[Series 3: a/p w/ 5mm · axial · 0.78mm/px · z∈[+747,+1192]mm · 13 of 99 slices shown, 15 images]
[im 5/99  soft-tissue]
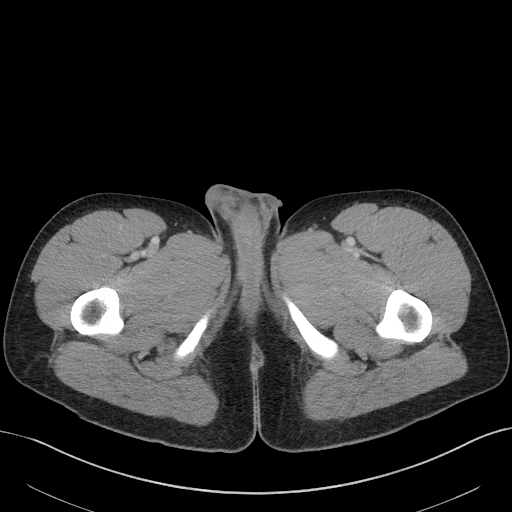
[im 5/99  bone]
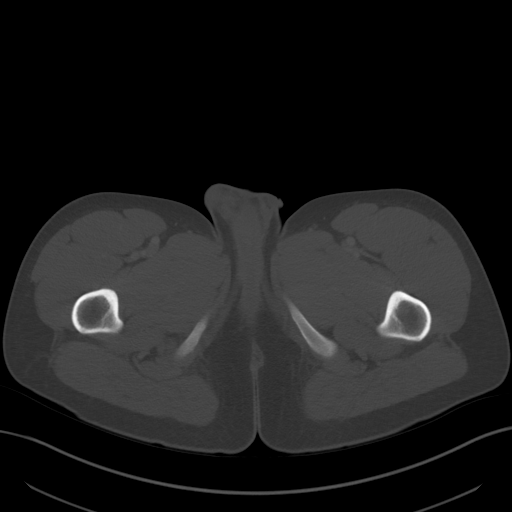
[im 13/99  soft-tissue]
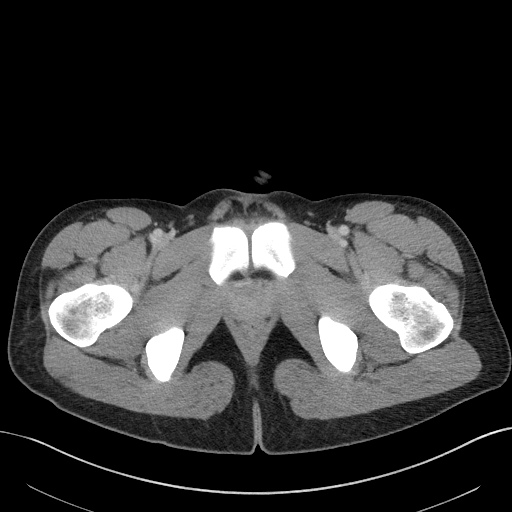
[im 21/99  soft-tissue]
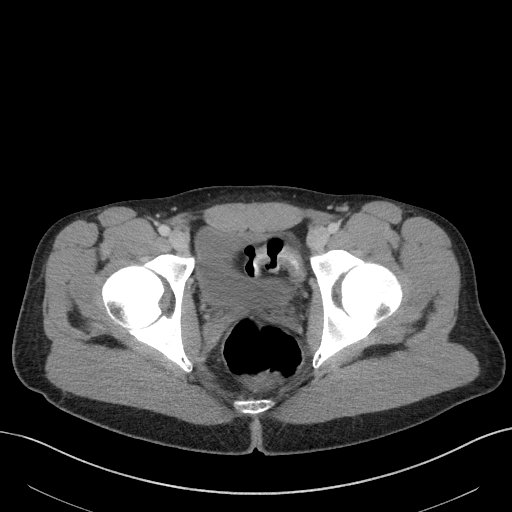
[im 29/99  soft-tissue]
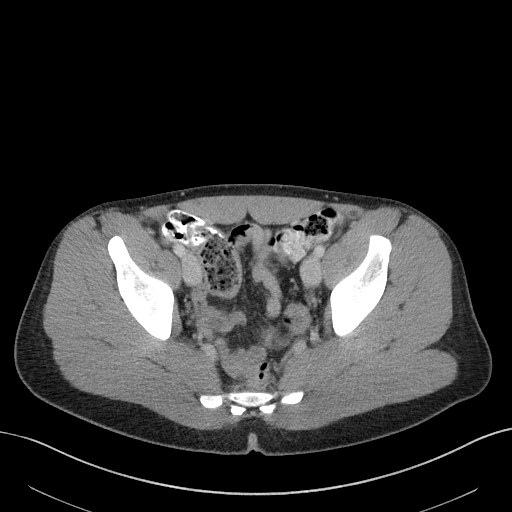
[im 33/99  soft-tissue]
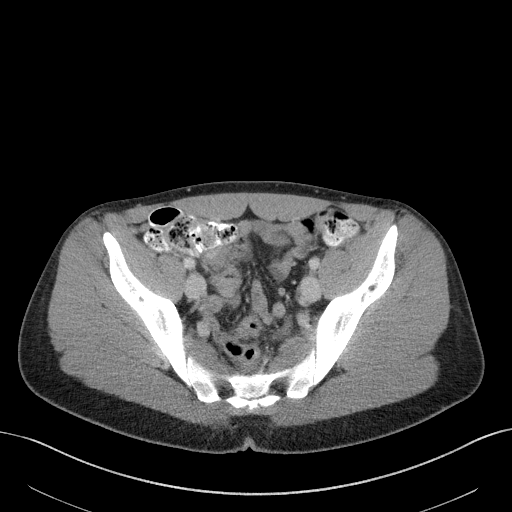
[im 41/99  soft-tissue]
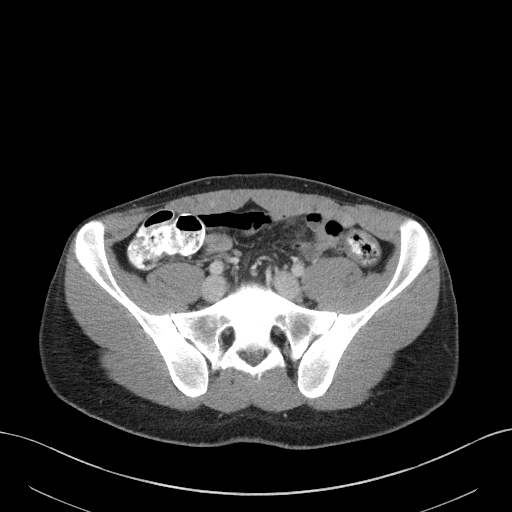
[im 50/99  soft-tissue]
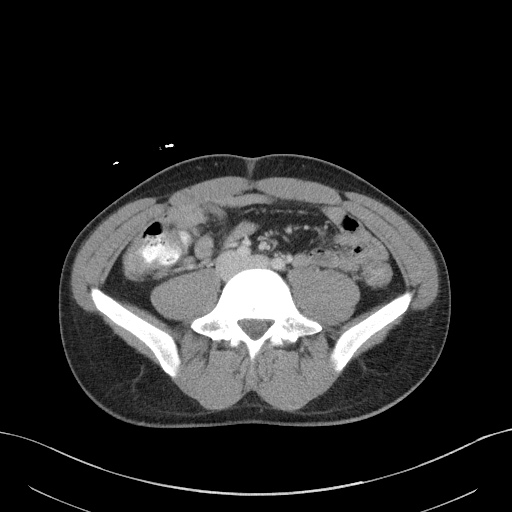
[im 58/99  soft-tissue]
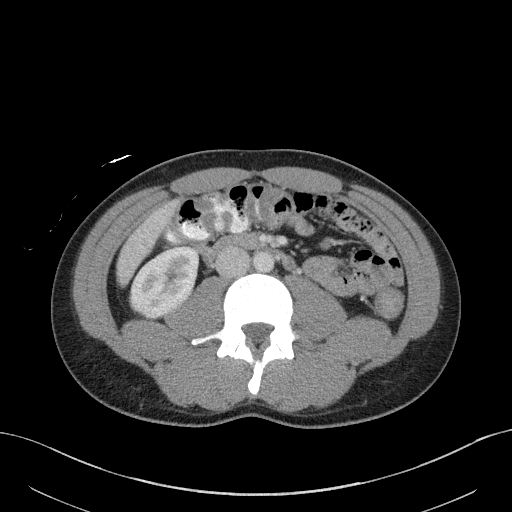
[im 66/99  soft-tissue]
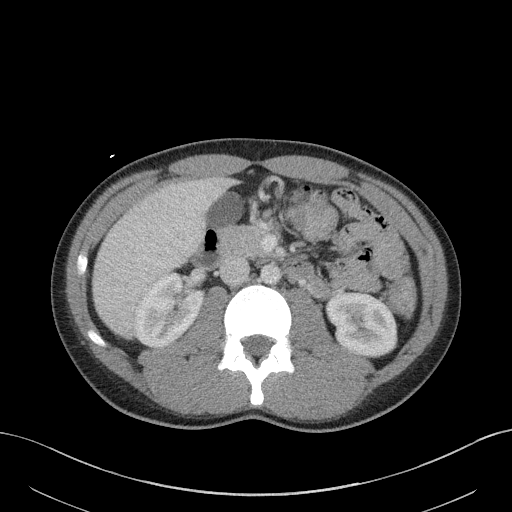
[im 66/99  bone]
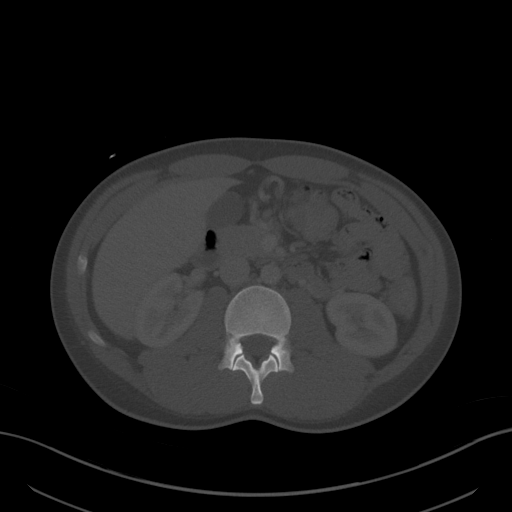
[im 70/99  soft-tissue]
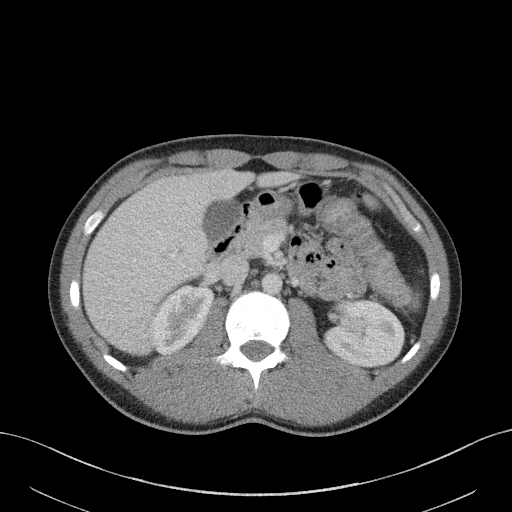
[im 78/99  soft-tissue]
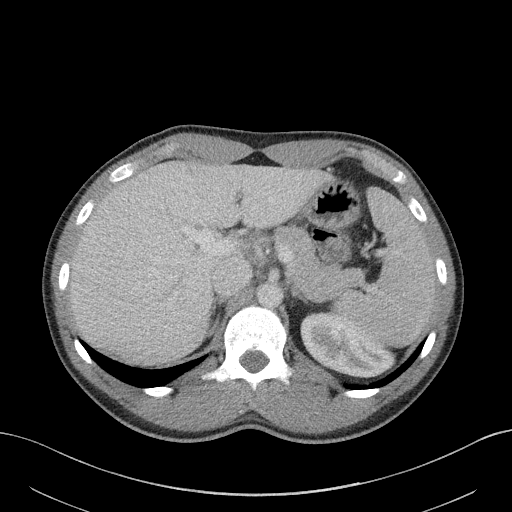
[im 86/99  soft-tissue]
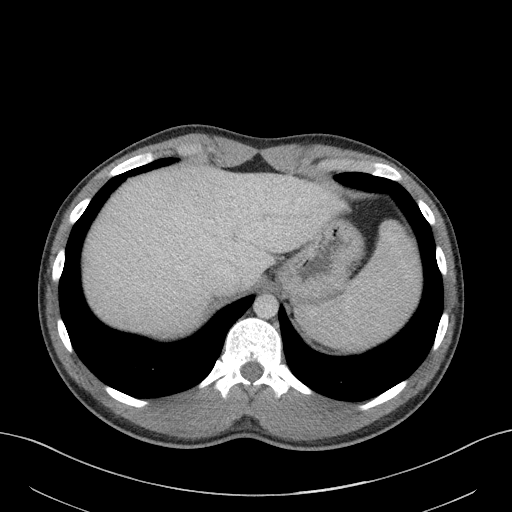
[im 94/99  soft-tissue]
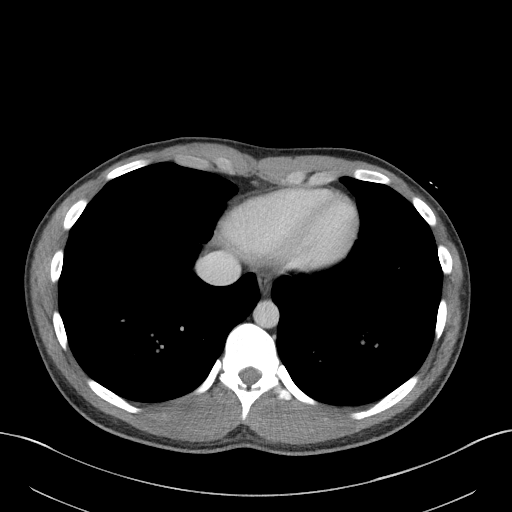

[Series 6: a/p w/ cor · coronal · 0.80mm/px · 3 of 126 slices shown]
[im 42/126  soft-tissue]
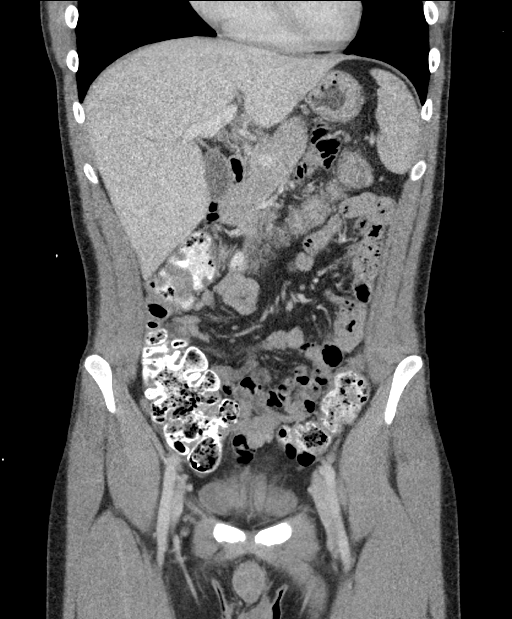
[im 56/126  soft-tissue]
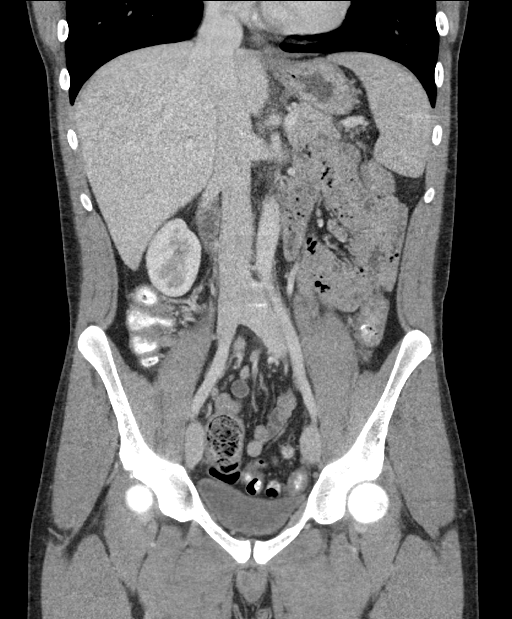
[im 70/126  soft-tissue]
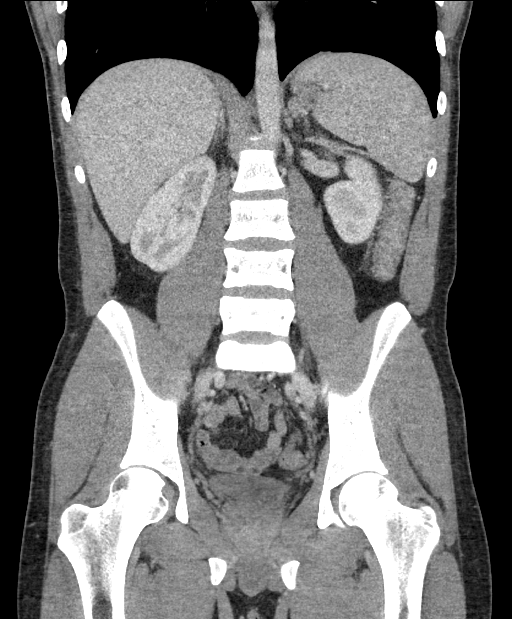

[16 of 46 positions shown; findings below may reference images not displayed]

FINDINGS: Lower chest: The visualized lung bases are grossly clear. The
visualized portions of the mediastinum are unremarkable.

Hepatobiliary: The liver is unremarkable in appearance. The
gallbladder is unremarkable in appearance. The common bile duct
remains normal in caliber.

Pancreas: The pancreas is within normal limits.

Spleen: The spleen is unremarkable in appearance.

Adrenals/Urinary Tract: The adrenal glands are unremarkable in
appearance.

A nonobstructing 4 mm stone is noted at the interpole region of the
right kidney. There is no evidence of hydronephrosis. No obstructing
ureteral stones are identified. No perinephric stranding is seen.

Stomach/Bowel: The stomach is unremarkable in appearance. The small
bowel is within normal limits. The appendix is normal in caliber,
without evidence of appendicitis.

Mild wall thickening is suggested along the distal transverse and
descending colon, concerning for an infectious or inflammatory
process.

Vascular/Lymphatic: The abdominal aorta is unremarkable in
appearance. The inferior vena cava is grossly unremarkable. No
retroperitoneal lymphadenopathy is seen. No pelvic sidewall
lymphadenopathy is identified.

Reproductive: The bladder is mildly distended and grossly
unremarkable. The prostate remains normal in size.

Other: No additional soft tissue abnormalities are seen.

Musculoskeletal: No acute osseous abnormalities are identified. The
visualized musculature is unremarkable in appearance.
IMPRESSION: 1. Mild wall thickening suggested along the distal transverse and
descending colon, concerning for an infectious or inflammatory
process.
2. Nonobstructing 4 mm stone at the interpole region of the right
kidney.

## 2019-05-15 DIAGNOSIS — H5203 Hypermetropia, bilateral: Secondary | ICD-10-CM | POA: Diagnosis not present

## 2019-05-15 DIAGNOSIS — H5043 Accommodative component in esotropia: Secondary | ICD-10-CM | POA: Diagnosis not present

## 2019-10-29 DIAGNOSIS — Z20828 Contact with and (suspected) exposure to other viral communicable diseases: Secondary | ICD-10-CM | POA: Diagnosis not present

## 2019-12-02 DIAGNOSIS — Z20828 Contact with and (suspected) exposure to other viral communicable diseases: Secondary | ICD-10-CM | POA: Diagnosis not present

## 2019-12-05 MED FILL — SODIUM FLUORIDE 5000 PPM 1.: 1.1 | 20 days supply | Qty: 100 | Fill #0

## 2020-09-07 DIAGNOSIS — Z20822 Contact with and (suspected) exposure to covid-19: Secondary | ICD-10-CM | POA: Diagnosis not present

## 2020-09-30 DIAGNOSIS — Z03818 Encounter for observation for suspected exposure to other biological agents ruled out: Secondary | ICD-10-CM | POA: Diagnosis not present

## 2020-09-30 DIAGNOSIS — Z20822 Contact with and (suspected) exposure to covid-19: Secondary | ICD-10-CM | POA: Diagnosis not present

## 2020-10-08 DIAGNOSIS — Z87442 Personal history of urinary calculi: Secondary | ICD-10-CM | POA: Diagnosis not present

## 2021-02-23 DIAGNOSIS — S82435A Nondisplaced oblique fracture of shaft of left fibula, initial encounter for closed fracture: Secondary | ICD-10-CM | POA: Diagnosis not present

## 2021-03-15 DIAGNOSIS — M25572 Pain in left ankle and joints of left foot: Secondary | ICD-10-CM | POA: Diagnosis not present

## 2021-03-16 DIAGNOSIS — M25572 Pain in left ankle and joints of left foot: Secondary | ICD-10-CM | POA: Diagnosis not present

## 2021-04-01 DIAGNOSIS — M25572 Pain in left ankle and joints of left foot: Secondary | ICD-10-CM | POA: Diagnosis not present

## 2021-05-13 DIAGNOSIS — N200 Calculus of kidney: Secondary | ICD-10-CM | POA: Diagnosis not present

## 2021-06-04 DIAGNOSIS — N200 Calculus of kidney: Secondary | ICD-10-CM | POA: Diagnosis not present

## 2021-06-05 DIAGNOSIS — R1084 Generalized abdominal pain: Secondary | ICD-10-CM | POA: Diagnosis not present

## 2022-09-12 ENCOUNTER — Other Ambulatory Visit (HOSPITAL_BASED_OUTPATIENT_CLINIC_OR_DEPARTMENT_OTHER): Payer: Self-pay

## 2022-09-12 MED ORDER — FLUARIX QUADRIVALENT 0.5 ML IM SUSY
PREFILLED_SYRINGE | INTRAMUSCULAR | 0 refills | Status: AC
  Filled 2022-09-12: qty 0.5, 1d supply, fill #0

## 2022-09-12 MED ORDER — COMIRNATY 30 MCG/0.3ML IM SUSY
PREFILLED_SYRINGE | INTRAMUSCULAR | 0 refills | Status: AC
  Filled 2022-09-12: qty 0.3, 1d supply, fill #0

## 2023-09-18 ENCOUNTER — Other Ambulatory Visit (HOSPITAL_BASED_OUTPATIENT_CLINIC_OR_DEPARTMENT_OTHER): Payer: Self-pay

## 2023-09-18 MED ORDER — COVID-19 MRNA VAC-TRIS(PFIZER) 30 MCG/0.3ML IM SUSY
0.3000 mL | PREFILLED_SYRINGE | Freq: Once | INTRAMUSCULAR | 0 refills | Status: AC
  Filled 2023-09-18: qty 0.3, 1d supply, fill #0

## 2023-09-18 MED ORDER — INFLUENZA VIRUS VACC SPLIT PF (FLUZONE) 0.5 ML IM SUSY
0.5000 mL | PREFILLED_SYRINGE | Freq: Once | INTRAMUSCULAR | 0 refills | Status: AC
  Filled 2023-09-18: qty 0.5, 1d supply, fill #0
